# Patient Record
Sex: Female | Born: 1960 | Race: Black or African American | Hispanic: No | Marital: Married | Smoking: Never smoker
Health system: Southern US, Community
[De-identification: ages and names within clinical notes are randomized; demographics above are authoritative.]

## PROBLEM LIST (undated history)

## (undated) DIAGNOSIS — R809 Proteinuria, unspecified: Secondary | ICD-10-CM

## (undated) DIAGNOSIS — E11319 Type 2 diabetes mellitus with unspecified diabetic retinopathy without macular edema: Secondary | ICD-10-CM

## (undated) DIAGNOSIS — I10 Essential (primary) hypertension: Secondary | ICD-10-CM

## (undated) DIAGNOSIS — E119 Type 2 diabetes mellitus without complications: Secondary | ICD-10-CM

## (undated) HISTORY — DX: Proteinuria, unspecified: R80.9

## (undated) HISTORY — PX: LIVER SURGERY: SHX698

## (undated) HISTORY — DX: Essential (primary) hypertension: I10

## (undated) HISTORY — DX: Type 2 diabetes mellitus with unspecified diabetic retinopathy without macular edema: E11.319

## (undated) HISTORY — DX: Type 2 diabetes mellitus without complications: E11.9

## (undated) HISTORY — PX: COLONOSCOPY: SHX174

---

## 2013-09-20 ENCOUNTER — Ambulatory Visit: Payer: Self-pay | Admitting: General Practice

## 2013-10-11 ENCOUNTER — Ambulatory Visit: Payer: Self-pay | Admitting: General Practice

## 2013-11-11 ENCOUNTER — Ambulatory Visit: Payer: Self-pay | Admitting: General Practice

## 2014-05-12 DIAGNOSIS — E119 Type 2 diabetes mellitus without complications: Secondary | ICD-10-CM | POA: Insufficient documentation

## 2014-05-23 DIAGNOSIS — I10 Essential (primary) hypertension: Secondary | ICD-10-CM | POA: Insufficient documentation

## 2014-09-19 DIAGNOSIS — R809 Proteinuria, unspecified: Secondary | ICD-10-CM | POA: Insufficient documentation

## 2016-12-12 DIAGNOSIS — H3582 Retinal ischemia: Secondary | ICD-10-CM | POA: Insufficient documentation

## 2016-12-12 DIAGNOSIS — H25813 Combined forms of age-related cataract, bilateral: Secondary | ICD-10-CM | POA: Insufficient documentation

## 2016-12-12 DIAGNOSIS — E113313 Type 2 diabetes mellitus with moderate nonproliferative diabetic retinopathy with macular edema, bilateral: Secondary | ICD-10-CM | POA: Insufficient documentation

## 2017-01-08 ENCOUNTER — Encounter: Payer: Self-pay | Admitting: Registered Nurse

## 2017-01-08 ENCOUNTER — Encounter: Payer: Self-pay | Admitting: Medical

## 2017-01-08 ENCOUNTER — Other Ambulatory Visit: Payer: Self-pay | Admitting: Registered Nurse

## 2017-01-08 ENCOUNTER — Ambulatory Visit: Payer: Self-pay | Admitting: Registered Nurse

## 2017-01-08 VITALS — BP 170/80 | HR 90 | Temp 98.7°F | Resp 16 | Ht 62.0 in | Wt 149.0 lb

## 2017-01-08 DIAGNOSIS — Z794 Long term (current) use of insulin: Secondary | ICD-10-CM

## 2017-01-08 DIAGNOSIS — E119 Type 2 diabetes mellitus without complications: Secondary | ICD-10-CM

## 2017-01-08 DIAGNOSIS — Z9049 Acquired absence of other specified parts of digestive tract: Secondary | ICD-10-CM | POA: Insufficient documentation

## 2017-01-08 DIAGNOSIS — I10 Essential (primary) hypertension: Secondary | ICD-10-CM

## 2017-01-08 DIAGNOSIS — H6593 Unspecified nonsuppurative otitis media, bilateral: Secondary | ICD-10-CM

## 2017-01-08 DIAGNOSIS — J019 Acute sinusitis, unspecified: Secondary | ICD-10-CM

## 2017-01-08 MED ORDER — SALINE SPRAY 0.65 % NA SOLN: 2.0000 | NASAL | 1 refills | Status: DC

## 2017-01-08 MED ORDER — FLUTICASONE PROPIONATE 50 MCG/ACT NA SUSP: 1.0000 | Freq: Two times a day (BID) | NASAL | 1 refills | Status: DC

## 2017-01-08 MED ORDER — AMOXICILLIN-POT CLAVULANATE 875-125 MG PO TABS: 1.0000 | ORAL_TABLET | Freq: Two times a day (BID) | ORAL | 0 refills | Status: DC

## 2017-01-08 NOTE — Progress Notes (Signed)
Subjective:    Patient ID: Gail Barber, female    DOB: [birth year], 56 y.o.   MRN: [id number]  55y/o african [place] female here for acute cough occasionally productive clear denied exposure to others sick at home or work.  Denied seasonal allergies feels like her bronchitis from about 5 years ago unsure what medications she took.  Denied hemoptysis, wheezing, chest pain, ear pain, ear discharge, sinus pain.  Finger sticks checking twice a day runs 112-140 (her usual).   PMHx Moderate nonproliferative diabetic retinopathy of both eyes with macular edema associated with type 2 diabetes mellitus (CMS-HCC) 12/12/2016 Retinal ischemia 12/12/2016 Combined forms of age-related cataract of both eyes 12/12/2016 Microalbuminuria 09/19/2014 Hypertension 05/23/2014 Type II diabetes mellitus (CMS-HCC) 05/12/2014  PSHx H/O resection of liver  Overview:  for hemangiomas   VESICOVAGINAL FISTULA CLOSURE W/ TAH    HYSTERECTOMY SUPRACERVICAL ABDOMINAL W/REMOVAL TUBES &/OR OVARIES 2000   liver rescetomy 1999   fibroid removal 2012   MYOMECTOMY ABDOMINAL 1989   HYSTERECTOMY    COLONOSCOPY 10/08/2016  Adenomatous Polyp, FH Colon Polyps (Sister): CBF 09/2021    [id number] Brother 3  High blood pressure (Hypertension) Brother 3  Kidney failure Brother 3  Alcohol abuse Father   Diabetes Maternal Grandmother   Anemia Mother   Diabetes Mother   Colon polyps Sister 2  Diabetes Sister 2  High blood pressure (Hypertension) Sister 2  Lung cancer Sister 2         Review of Systems  Constitutional: Negative for activity change, appetite change, chills, diaphoresis, fatigue, fever and unexpected weight change.  HENT: Positive for congestion, postnasal drip, rhinorrhea and sore throat. Negative for dental problem, drooling, ear discharge, ear pain, facial swelling, hearing loss, mouth sores, nosebleeds, sinus pain, sinus pressure, sneezing, tinnitus, trouble  swallowing and voice change.   Eyes: Negative for photophobia, pain, discharge, redness, itching and visual disturbance.  Respiratory: Positive for cough. Negative for choking, chest tightness, shortness of breath, wheezing and stridor.   Cardiovascular: Negative for chest pain, palpitations and leg swelling.  Gastrointestinal: Negative for abdominal distention, abdominal pain, blood in stool, constipation, diarrhea, nausea and vomiting.  Endocrine: Negative for cold intolerance and heat intolerance.  Genitourinary: Negative for difficulty urinating, dysuria and hematuria.  Musculoskeletal: Negative for arthralgias, back pain, gait problem, joint swelling, myalgias, neck pain and neck stiffness.  Skin: Negative for color change, pallor, rash and wound.  Allergic/Immunologic: Negative for environmental allergies and food allergies.  Neurological: Negative for dizziness, tremors, seizures, syncope, facial asymmetry, speech difficulty, weakness, light-headedness, numbness and headaches.  Hematological: Negative for adenopathy. Does not bruise/bleed easily.  Psychiatric/Behavioral: Negative for agitation, behavioral problems, confusion and sleep disturbance.       Objective:   Physical Exam  Constitutional: She is oriented to person, place, and time. She appears well-developed and well-nourished. She is active and cooperative.  Non-toxic appearance. She does not have a sickly appearance. She does not appear ill. No distress.  HENT:  Head: Normocephalic and atraumatic.  Right Ear: Hearing, external ear and ear canal normal. A middle ear effusion is present.  Left Ear: Hearing, external ear and ear canal normal. A middle ear effusion is present.  Nose: Mucosal edema and rhinorrhea present. No nose lacerations, sinus tenderness, nasal deformity, septal deviation or nasal septal hematoma. No epistaxis.  No foreign bodies. Right sinus exhibits no maxillary sinus tenderness and no frontal sinus  tenderness. Left sinus exhibits no maxillary sinus tenderness and no frontal sinus tenderness.  Mouth/Throat: Uvula is midline  and mucous membranes are normal. Mucous membranes are not pale, not dry and not cyanotic. She does not have dentures. No oral lesions. No trismus in the jaw. Normal dentition. No dental abscesses, uvula swelling, lacerations or dental caries. Posterior oropharyngeal edema and posterior oropharyngeal erythema present. No oropharyngeal exudate or tonsillar abscesses.  Frequent sniffing in exam room; clear nasal discharge bilaterally; bilateral allergic shiners; cobblestoning posterior pharynx; bilateral TMs with air fluid Barber 50% opacity  Eyes: Conjunctivae, EOM and lids are normal. Pupils are equal, round, and reactive to light. Right eye exhibits no chemosis, no discharge, no exudate and no hordeolum. No foreign body present in the right eye. Left eye exhibits no chemosis, no discharge, no exudate and no hordeolum. No foreign body present in the left eye. Right conjunctiva is not injected. Right conjunctiva has no hemorrhage. Left conjunctiva is not injected. Left conjunctiva has no hemorrhage. No scleral icterus. Right eye exhibits normal extraocular motion and no nystagmus. Left eye exhibits normal extraocular motion and no nystagmus. Right pupil is round and reactive. Left pupil is round and reactive. Pupils are equal.  Neck: Trachea normal and normal range of motion. Neck supple. No tracheal tenderness, no spinous process tenderness and no muscular tenderness present. No neck rigidity. No tracheal deviation, no edema, no erythema and normal range of motion present. No thyroid mass and no thyromegaly present.  Cardiovascular: Normal rate, regular rhythm, S1 normal, S2 normal, normal heart sounds and intact distal pulses.  PMI is not displaced.  Exam reveals no gallop and no friction rub.   No murmur heard. Pulmonary/Chest: Effort normal and breath sounds normal. No accessory  muscle usage or stridor. No respiratory distress. She has no decreased breath sounds. She has no wheezes. She has no rhonchi. She has no rales. She exhibits no tenderness.  sp02 99% room air while speaking full sentences without difficulty cough not observed in exam room  Abdominal: Soft. She exhibits no distension.  Musculoskeletal: Normal range of motion. She exhibits no edema or tenderness.       Right shoulder: Normal.       Left shoulder: Normal.       Right hip: Normal.       Left hip: Normal.       Right knee: Normal.       Left knee: Normal.       Cervical back: Normal.       Right hand: Normal.       Left hand: Normal.  Lymphadenopathy:       Head (right side): No submental, no submandibular, no tonsillar, no preauricular, no posterior auricular and no occipital adenopathy present.       Head (left side): No submental, no submandibular, no tonsillar, no preauricular, no posterior auricular and no occipital adenopathy present.    She has no cervical adenopathy.       Right cervical: No superficial cervical, no deep cervical and no posterior cervical adenopathy present.      Left cervical: No superficial cervical, no deep cervical and no posterior cervical adenopathy present.  Neurological: She is alert and oriented to person, place, and time. She has normal strength. She is not disoriented. She displays no atrophy and no tremor. No cranial nerve deficit or sensory deficit. She exhibits normal muscle tone. She displays no seizure activity. Coordination and gait normal. GCS eye subscore is 4. GCS verbal subscore is 5. GCS motor subscore is 6.  Skin: Skin is warm, dry and intact. No abrasion, no  bruising, no burn, no ecchymosis, no laceration, no lesion, no petechiae and no rash noted. She is not diaphoretic. No cyanosis or erythema. No pallor. Nails show no clubbing.  Psychiatric: She has a normal mood and affect. Her speech is normal and behavior is normal. Judgment and thought content  normal. Cognition and memory are normal.  Nursing note and vitals reviewed.   Vitals:   01/08/17 1010  BP: (!) 170/80  Pulse: 90  Resp: 16  Temp: 98.7 F (37.1 C)        Assessment & Plan:  A-acute rhinitis, otitis media effusion bilaterally, hypertension  P- Supportive treatment.   No evidence of invasive bacterial infection, non toxic and well hydrated.  This is most likely self limiting viral infection.  I do not see where any further testing or imaging is necessary at this time.   I will suggest supportive care, rest, good hygiene and encourage the patient to take adequate fluids.  The patient is to return to clinic or EMERGENCY ROOM if symptoms worsen or change significantly e.g. ear pain, fever, purulent discharge from ears or bleeding.  Start augmentin 875mg  po BID x 10 days #20 RF0 electronic Rx given if ear pain/discharge/fever.  Patient verbalized agreement and understanding of treatment plan and had no further questions at this time     Suspect Viral illness: no evidence of invasive bacterial infection, non toxic and well hydrated.  This is most likely self limiting viral infection.  I do not see where any further testing or imaging is necessary at this time.   I will suggest supportive care, rest, good hygiene and encourage the patient to take adequate fluids.  Does not require work excuse.   Rx flonase 1 spray each nostril BID prn, nasal saline 1-2 sprays each nostril prn q2h.  Avoid aleve/naproxen 500mg  po BID/ibuprofen/advil/ motrin 800mg  po TID prn as will increase blood pressure.  Discussed sugar free cough lozenges with lemon and salt water gargles for comfort also.  The patient is to return to clinic or EMERGENCY ROOM if symptoms worsen or change significantly e.g. fever, lethargy, SOB, wheezing.  Patient verbalized agreement and understanding of treatment plan.    start flonase 1 spray each nostril BID #1 RF0 Rx given electronically, saline 2 sprays each nostril q2h prn  congestion.  If no improvement with 48 hours of saline and flonase use start augmentin 875mg  po BID x 10 days.  Rx given electronically .  No evidence of systemic bacterial infection, non toxic and well hydrated.  I do not see where any further testing or imaging is necessary at this time.   I will suggest supportive care, rest, good hygiene and encourage the patient to take adequate fluids.  The patient is to return to clinic or EMERGENCY ROOM if symptoms worsen or change significantly.  Exitcare handout on sinusitis given to patient.  Patient verbalized agreement and understanding of treatment plan and had no further questions at this time.   P2:  Hand washing and cover cough  Usually no specific medical treatment is needed if a virus is causing the sore throat.  The throat most often gets better on its own within 5 to 7 days.  Antibiotic medicine does not cure viral pharyngitis.   For acute pharyngitis caused by bacteria, your healthcare provider will prescribe an antibiotic.  Marland Kitchen Do not smoke.  Marland Kitchen Avoid secondhand smoke and other air pollutants.  . Use a cool mist humidifier to add moisture to the air.  Marland Kitchen  Get plenty of rest.  . You may want to rest your throat by talking less and eating a diet that is mostly liquid or soft for a day or two.   Marland Kitchen Nonprescription throat lozenges and mouthwashes should help relieve the soreness.   . Gargling with warm saltwater and drinking warm liquids may help.  (You can make a saltwater solution by adding 1/4 teaspoon of salt to 8 ounces, or 240 mL, of warm water.)  . A nonprescription pain reliever such as aspirin, acetaminophen, or ibuprofen may ease general aches and pains.   FOLLOW UP with clinic provider if no improvements in the next 7-10 days.  Patient verbalized understanding of instructions and agreed with plan of care. P2:  Hand washing and diet.  Shower BID.  Hydrate.  Humidifier.  Suspect viral post nasal drip cause of inflammation discussed flonase and  nasal saline to dry up drip.  Avoid sudafed as will increase blood pressure.  Bronchitis simple, community acquired, may have started as viral (probably respiratory syncytial, parainfluenza, influenza, or adenovirus), but now evidence of acute purulent bronchitis with resultant bronchial edema and mucus formation.  Viruses are the most common cause of bronchial inflammation in otherwise healthy adults with acute bronchitis.  The appearance of sputum is not predictive of whether a bacterial infection is present.  Purulent sputum is most often caused by viral infections.  There are a small portion of those caused by non-viral agents being Mycoplamsa pneumonia.  Microscopic examination or C&S of sputum in the healthy adult with acute bronchitis is generally not helpful (usually negative or normal respiratory flora) other considerations being cough from upper respiratory tract infections, sinusitis or allergic syndromes (mild asthma or viral pneumonia).  Differential Diagnosis:  reactive airway disease (asthma, allergic aspergillosis (eosinophilia), chronic bronchitis, respiratory infection (Sinusitis, Common cold, pneumonia), congestive heart failure, reflux esophagitis, bronchogenic tumor, aspiration syndromes and/or exposure irritants/tobacco smoke.  In this case, there is no evidence of any invasive bacterial illness.  Most likely viral etiology so will hold on antibiotic treatment.  Advise supportive care with rest, encourage fluids, good hygiene and watch for any worsening symptoms.  If they were to develop:  come back to the office or go to the emergency room if after hours.  Without high fever, severe dyspnea, lack of physical findings or other risk factors, I will hold on a chest radiograph and CBC at this time. I discussed that approximately 50% of patients with acute bronchitis have a cough that lasts up to three weeks, and 25% for over a month.  Tylenol, one to two tablets every four hours as needed for  fever or myalgias.   No aspirin.  Patient instructed to follow up in one week or sooner if symptoms worsen. Patient verbalized agreement and understanding of treatment plan.  P2:  hand washing and cover cough  Hypertension sick today take medications as prescribed.  Continue current medications as directed.  Continue to monitor blood pressure at home and maintain log of blood pressure and pulse to bring to follow up appointments.  Continue low sodium diet and exercise program.  Recommended weight loss/weight maintenance to BMI 20-25.  Return to the clinic if any new symptoms.  Follow up hand/feet swelling, chest pain, shortness of breath, worst headache of life.  Patient verbalized agreement and understanding of treatment plan and had no further questions at this time.   P2:  Diet and Exercise specific for HTN

## 2017-01-08 NOTE — Patient Instructions (Addendum)
Start taking augmentin 875mg  by mouth twice a day if cough not improved after using fluticasone and nasal saline twice a day for 2 days Hydrate Tylenol 1000mg  by mouth every 6 hours as needed for pain (cautious use short term due to liver resection) Avoid motrin/advil/aleve/naproxen/ibuprofen due to high BP will raise blood pressure Shower twice a day  Otitis Media, Adult Otitis media is redness, soreness, and puffiness (swelling) in the space just behind your eardrum (middle ear). It may be caused by allergies or infection. It often happens along with a cold. Follow these instructions at home:  Take your medicine as told. Finish it even if you start to feel better.  Only take over-the-counter or prescription medicines for pain, discomfort, or fever as told by your doctor.  Follow up with your doctor as told. Contact a doctor if:  You have otitis media only in one ear, or bleeding from your nose, or both.  You notice a lump on your neck.  You are not getting better in 3-5 days.  You feel worse instead of better. Get help right away if:  You have pain that is not helped with medicine.  You have puffiness, redness, or pain around your ear.  You get a stiff neck.  You cannot move part of your face (paralysis).  You notice that the bone behind your ear hurts when you touch it. This information is not intended to replace advice given to you by your health care provider. Make sure you discuss any questions you have with your health care provider. Document Released: 04/15/2008 Document Revised: 04/04/2016 Document Reviewed: 05/25/2013 Elsevier Interactive Patient Education  2017 [employer] Acute Bronchitis, Adult Acute bronchitis is when air tubes (bronchi) in the lungs suddenly get swollen. The condition can make it hard to breathe. It can also cause these symptoms:  A cough.  Coughing up clear, yellow, or green mucus.  Wheezing.  Chest congestion.  Shortness of  breath.  A fever.  Body aches.  Chills.  A sore throat. Follow these instructions at home: Medicines   Take over-the-counter and prescription medicines only as told by your doctor.  If you were prescribed an antibiotic medicine, take it as told by your doctor. Do not stop taking the antibiotic even if you start to feel better. General instructions   Rest.  Drink enough fluids to keep your pee (urine) clear or pale yellow.  Avoid smoking and secondhand smoke. If you smoke and you need help quitting, ask your doctor. Quitting will help your lungs heal faster.  Use an inhaler, cool mist vaporizer, or humidifier as told by your doctor.  Keep all follow-up visits as told by your doctor. This is important. How is this prevented? To lower your risk of getting this condition again:  Wash your hands often with soap and water. If you cannot use soap and water, use hand sanitizer.  Avoid contact with people who have cold symptoms.  Try not to touch your hands to your mouth, nose, or eyes.  Make sure to get the flu shot every year. Contact a doctor if:  Your symptoms do not get better in 2 weeks. Get help right away if:  You cough up blood.  You have chest pain.  You have very bad shortness of breath.  You become dehydrated.  You faint (pass out) or keep feeling like you are going to pass out.  You keep throwing up (vomiting).  You have a very bad headache.  Your fever or  chills gets worse. This information is not intended to replace advice given to you by your health care provider. Make sure you discuss any questions you have with your health care provider. Document Released: 04/15/2008 Document Revised: 06/05/2016 Document Reviewed: 04/17/2016 Elsevier Interactive Patient Education  2017 [employer] Nonallergic Rhinitis Nonallergic rhinitis is a condition that causes symptoms that affect the nose, such as a runny nose and a stuffed-up nose (nasal congestion) that  can make it hard to breathe through the nose. This condition is different from having an allergy (allergic rhinitis). Allergic rhinitis occurs when the body's defense system (immune system) reacts to a substance that you are allergic to (allergen), such as pollen, pet dander, mold, or dust. Nonallergic rhinitis has many similar symptoms, but it is not caused by allergens. Nonallergic rhinitis can be a short-term or long-term problem. What are the causes? This condition can be caused by many different things. Some common types of nonallergic rhinitis include: Infectious rhinitis   This is usually due to an infection in the upper respiratory tract. Vasomotor rhinitis   This is the most common type of long-term nonallergic rhinitis.  It is caused by too much blood flow through the nose, which makes the tissue inside of the nose swell.  Symptoms are often triggered by strong odors, cold air, stress, drinking alcohol, cigarette smoke, or changes in the weather. Occupational rhinitis   This type is caused by triggers in the workplace, such as chemicals, dusts, animal dander, or air pollution. Hormonal rhinitis   This type occurs in women as a result of an increase in the female hormone estrogen.  It may occur during pregnancy, puberty, and menstrual cycles.  Symptoms improve when estrogen levels drop. Drug-induced rhinitis  Several drugs can cause nonallergic rhinitis, including:  Medicines that are used to treat high blood pressure, heart disease, and Parkinson disease.  Aspirin and NSAIDs.  Over-the-counter nasal decongestant sprays. These can cause a type of nonallergic rhinitis (rhinitis medicamentosa) when they are used for more than a few days. Nonallergic rhinitis with eosinophilia syndrome (NARES)   This type is caused by having too much of a certain type of white blood cell (eosinophil). Nonallergic rhinitis can also be caused by a reaction to eating hot or spicy foods. This does  not usually cause long-term symptoms. In some cases, the cause of nonallergic rhinitis is not known. What increases the risk? You are more likely to develop this condition if:  You are 69-65 years of age.  You are a woman. Women are twice as likely to have this condition. What are the signs or symptoms? Common symptoms of this condition include:  Nasal congestion.  Runny nose.  The feeling of mucus going down the back of the throat (postnasal drip).  Trouble sleeping at night and daytime sleepiness. Less common symptoms include:  Sneezing.  Coughing.  Itchy nose.  Bloodshot eyes. How is this diagnosed? This condition may be diagnosed based on:  Your symptoms and medical history.  A physical exam.  Allergy testing to rule out allergic rhinitis. You may have skin tests or blood tests. In some cases, the health care provider may take a swab of nasal secretions to look for an increased number of eosinophils. This would be done to confirm a diagnosis of NARES. How is this treated? Treatment for this condition depends on the cause. No single treatment works for everyone. Work with your health care provider to find the best treatment for you. Treatment may include:  Avoiding the things that trigger your symptoms.  Using medicines to relieve congestion, such as:  Steroid nasal spray. There are many types. You may need to try a few to find out which one works best.  Best boy medicine. This may be an oral medicine or a nasal spray. These medicines are only used for a short time.  Using medicines to relieve a runny nose. These may include antihistamine medicines or anticholinergic nasal sprays. Surgery to remove tissue from inside the nose may be needed in severe cases if the condition has not improved after 6-12 months of medical treatment. Follow these instructions at home:  Take or use over-the-counter and prescription medicines only as told by your health care provider.  Do not stop using your medicine even if you start to feel better.  Use salt-water (saline) rinses or other solutions (nasal washes or irrigations) to wash or rinse out the inside of your nose as told by your health care provider.  Do not take NSAIDs or medicines that contain aspirin if they make your symptoms worse.  Do not drink alcohol if it makes your symptoms worse.  Do not use any tobacco products, such as cigarettes, chewing tobacco, and e-cigarettes. If you need help quitting, ask your health care provider.  Avoid secondhand smoke.  Get some exercise every day. Exercise may help reduce symptoms of nonallergic rhinitis for some people. Ask your health care provider how much exercise and what types of exercise are safe for you.  Sleep with the head of your bed raised (elevated). This may reduce nighttime nasal congestion.  Keep all follow-up visits as told by your health care provider. This is important. Contact a health care provider if:  You have a fever.  Your symptoms are getting worse at home.  Your symptoms are not responding to medicine.  You develop new symptoms, especially a headache or nosebleed. This information is not intended to replace advice given to you by your health care provider. Make sure you discuss any questions you have with your health care provider. Document Released: 02/19/2016 Document Revised: 04/04/2016 Document Reviewed: 01/18/2016 Elsevier Interactive Patient Education  2017 [employer].

## 2017-01-10 ENCOUNTER — Telehealth: Payer: Self-pay | Admitting: Medical

## 2017-01-10 MED ORDER — BENZONATATE 100 MG PO CAPS: 100.0000 mg | ORAL_CAPSULE | Freq: Three times a day (TID) | ORAL | 0 refills | Status: DC | PRN

## 2017-01-10 NOTE — Telephone Encounter (Signed)
Called Gail Barber, let her know that [facility] sent rx for tessalon perles to [facility].

## 2017-01-10 NOTE — Telephone Encounter (Signed)
Gail Barber called and said that she can't sleep for coughing. Saw PA this week got antibiotic and nose spray but now needs something for cough. Can you call her in something at [facility]?

## 2017-02-26 ENCOUNTER — Other Ambulatory Visit: Payer: Self-pay | Admitting: *Deleted

## 2017-02-26 DIAGNOSIS — E781 Pure hyperglyceridemia: Secondary | ICD-10-CM

## 2017-02-26 DIAGNOSIS — Z794 Long term (current) use of insulin: Secondary | ICD-10-CM

## 2017-02-26 DIAGNOSIS — E118 Type 2 diabetes mellitus with unspecified complications: Secondary | ICD-10-CM

## 2017-02-27 LAB — SPECIMEN STATUS

## 2017-02-28 LAB — LIPID PANEL
CHOLESTEROL TOTAL: 180 mg/dL (ref 100–199)
Chol/HDL Ratio: 3.5 ratio (ref 0.0–4.4)
HDL: 52 mg/dL (ref >39)
LDL CALC: 88 mg/dL (ref 0–99)
Triglycerides: 200 mg/dL — ABNORMAL HIGH (ref 0–149)
VLDL Cholesterol Cal: 40 mg/dL (ref 5–40)

## 2017-02-28 LAB — BASIC METABOLIC PANEL
BUN/Creatinine Ratio: 38 — ABNORMAL HIGH (ref 9–23)
BUN: 24 mg/dL (ref 6–24)
CO2: 21 mmol/L (ref 18–29)
CREATININE: 0.64 mg/dL (ref 0.57–1.00)
Calcium: 9.8 mg/dL (ref 8.7–10.2)
Chloride: 100 mmol/L (ref 96–106)
GFR calc Af Amer: 117 mL/min/{1.73_m2} (ref >59)
GFR calc non Af Amer: 101 mL/min/{1.73_m2} (ref >59)
GLUCOSE: 119 mg/dL — AB (ref 65–99)
Potassium: 4.3 mmol/L (ref 3.5–5.2)
SODIUM: 142 mmol/L (ref 134–144)

## 2017-02-28 LAB — HGB A1C W/O EAG: HEMOGLOBIN A1C: 8.7 % — AB (ref 4.8–5.6)

## 2017-02-28 LAB — VITAMIN B12: VITAMIN B 12: 1448 pg/mL — AB (ref 232–1245)

## 2017-07-01 ENCOUNTER — Other Ambulatory Visit: Payer: Self-pay

## 2017-07-01 DIAGNOSIS — E781 Pure hyperglyceridemia: Secondary | ICD-10-CM

## 2017-07-01 DIAGNOSIS — R809 Proteinuria, unspecified: Principal | ICD-10-CM

## 2017-07-01 DIAGNOSIS — E1129 Type 2 diabetes mellitus with other diabetic kidney complication: Secondary | ICD-10-CM

## 2017-07-01 DIAGNOSIS — Z794 Long term (current) use of insulin: Principal | ICD-10-CM

## 2017-07-02 LAB — SPECIMEN STATUS

## 2017-07-02 LAB — BASIC METABOLIC PANEL
BUN / CREAT RATIO: 23 (ref 9–23)
BUN: 18 mg/dL (ref 6–24)
CALCIUM: 10 mg/dL (ref 8.7–10.2)
CHLORIDE: 101 mmol/L (ref 96–106)
CO2: 22 mmol/L (ref 20–29)
Creatinine, Ser: 0.79 mg/dL (ref 0.57–1.00)
GFR calc non Af Amer: 85 mL/min/{1.73_m2} (ref >59)
GFR, EST AFRICAN AMERICAN: 97 mL/min/{1.73_m2} (ref >59)
Glucose: 113 mg/dL — ABNORMAL HIGH (ref 65–99)
POTASSIUM: 4.5 mmol/L (ref 3.5–5.2)
Sodium: 141 mmol/L (ref 134–144)

## 2017-07-02 LAB — LIPID PANEL
CHOL/HDL RATIO: 3.4 ratio (ref 0.0–4.4)
Cholesterol, Total: 171 mg/dL (ref 100–199)
HDL: 51 mg/dL (ref >39)
LDL CALC: 86 mg/dL (ref 0–99)
TRIGLYCERIDES: 172 mg/dL — AB (ref 0–149)
VLDL Cholesterol Cal: 34 mg/dL (ref 5–40)

## 2017-07-02 LAB — MICROALBUMIN / CREATININE URINE RATIO
CREATININE, UR: 100.3 mg/dL
Microalb/Creat Ratio: 73.9 mg/g creat — ABNORMAL HIGH (ref 0.0–30.0)
Microalbumin, Urine: 74.1 ug/mL

## 2017-07-02 LAB — SPECIMEN STATUS REPORT

## 2017-07-02 LAB — TSH: TSH: 1.93 u[IU]/mL (ref 0.450–4.500)

## 2017-07-02 NOTE — Addendum Note (Signed)
Addended by: Maura Crandall on: 07/02/2017 01:32 PM   Modules accepted: Orders

## 2017-07-03 LAB — VITAMIN B12

## 2017-07-03 LAB — HGB A1C W/O EAG: HEMOGLOBIN A1C: 8.8 % — AB (ref 4.8–5.6)

## 2017-07-03 LAB — SPECIMEN STATUS REPORT

## 2017-08-20 ENCOUNTER — Other Ambulatory Visit: Payer: Self-pay | Admitting: Physician Assistant

## 2017-08-20 DIAGNOSIS — Z0000 Encounter for general adult medical examination without abnormal findings: Secondary | ICD-10-CM

## 2017-08-20 DIAGNOSIS — Z1239 Encounter for other screening for malignant neoplasm of breast: Secondary | ICD-10-CM

## 2017-08-27 ENCOUNTER — Ambulatory Visit: Payer: Self-pay

## 2017-08-27 DIAGNOSIS — Z23 Encounter for immunization: Secondary | ICD-10-CM

## 2017-09-10 ENCOUNTER — Other Ambulatory Visit: Payer: Self-pay

## 2017-09-12 ENCOUNTER — Other Ambulatory Visit: Payer: Self-pay

## 2017-09-12 DIAGNOSIS — Z794 Long term (current) use of insulin: Principal | ICD-10-CM

## 2017-09-12 DIAGNOSIS — R809 Proteinuria, unspecified: Principal | ICD-10-CM

## 2017-09-12 DIAGNOSIS — E1129 Type 2 diabetes mellitus with other diabetic kidney complication: Secondary | ICD-10-CM

## 2017-09-12 DIAGNOSIS — E781 Pure hyperglyceridemia: Secondary | ICD-10-CM

## 2017-09-13 LAB — BASIC METABOLIC PANEL
BUN / CREAT RATIO: 29 — AB (ref 9–23)
BUN: 20 mg/dL (ref 6–24)
CALCIUM: 9.7 mg/dL (ref 8.7–10.2)
CO2: 21 mmol/L (ref 20–29)
CREATININE: 0.69 mg/dL (ref 0.57–1.00)
Chloride: 101 mmol/L (ref 96–106)
GFR calc non Af Amer: 98 mL/min/{1.73_m2} (ref >59)
GFR, EST AFRICAN AMERICAN: 113 mL/min/{1.73_m2} (ref >59)
Glucose: 73 mg/dL (ref 65–99)
Potassium: 4.3 mmol/L (ref 3.5–5.2)
Sodium: 140 mmol/L (ref 134–144)

## 2017-09-13 LAB — B12 AND FOLATE PANEL
Folate: 16.1 ng/mL (ref >3.0)
Vitamin B-12: 1510 pg/mL — ABNORMAL HIGH (ref 232–1245)

## 2017-09-13 LAB — LIPID PANEL
Chol/HDL Ratio: 3 ratio (ref 0.0–4.4)
Cholesterol, Total: 173 mg/dL (ref 100–199)
HDL: 58 mg/dL (ref >39)
LDL Calculated: 89 mg/dL (ref 0–99)
TRIGLYCERIDES: 130 mg/dL (ref 0–149)
VLDL Cholesterol Cal: 26 mg/dL (ref 5–40)

## 2017-09-13 LAB — HGB A1C W/O EAG: HEMOGLOBIN A1C: 8.8 % — AB (ref 4.8–5.6)

## 2017-09-13 LAB — TSH: TSH: 1.8 u[IU]/mL (ref 0.450–4.500)

## 2017-09-13 LAB — SPECIMEN STATUS REPORT

## 2017-09-18 LAB — MICROALBUMIN / CREATININE URINE RATIO
Creatinine, Urine: 51.4 mg/dL
MICROALB/CREAT RATIO: 101.9 mg/g{creat} — AB (ref 0.0–30.0)
MICROALBUM., U, RANDOM: 52.4 ug/mL

## 2017-10-07 DIAGNOSIS — Z961 Presence of intraocular lens: Secondary | ICD-10-CM | POA: Insufficient documentation

## 2017-10-07 DIAGNOSIS — E113391 Type 2 diabetes mellitus with moderate nonproliferative diabetic retinopathy without macular edema, right eye: Secondary | ICD-10-CM | POA: Insufficient documentation

## 2017-12-17 ENCOUNTER — Other Ambulatory Visit: Payer: Self-pay

## 2017-12-17 DIAGNOSIS — E1100 Type 2 diabetes mellitus with hyperosmolarity without nonketotic hyperglycemic-hyperosmolar coma (NKHHC): Secondary | ICD-10-CM

## 2017-12-18 LAB — HGB A1C W/O EAG: Hgb A1c MFr Bld: 7.9 % — ABNORMAL HIGH (ref 4.8–5.6)

## 2018-02-25 ENCOUNTER — Other Ambulatory Visit: Payer: Self-pay

## 2018-02-25 DIAGNOSIS — E119 Type 2 diabetes mellitus without complications: Secondary | ICD-10-CM

## 2018-02-25 DIAGNOSIS — E1129 Type 2 diabetes mellitus with other diabetic kidney complication: Secondary | ICD-10-CM

## 2018-02-25 DIAGNOSIS — E781 Pure hyperglyceridemia: Secondary | ICD-10-CM

## 2018-02-25 DIAGNOSIS — R809 Proteinuria, unspecified: Secondary | ICD-10-CM

## 2018-02-25 DIAGNOSIS — Z794 Long term (current) use of insulin: Principal | ICD-10-CM

## 2018-02-26 LAB — BASIC METABOLIC PANEL WITH GFR
BUN/Creatinine Ratio: 42 — ABNORMAL HIGH (ref 9–23)
BUN: 24 mg/dL (ref 6–24)
CO2: 21 mmol/L (ref 20–29)
Calcium: 9.8 mg/dL (ref 8.7–10.2)
Chloride: 102 mmol/L (ref 96–106)
Creatinine, Ser: 0.57 mg/dL (ref 0.57–1.00)
GFR calc Af Amer: 121 mL/min/1.73
GFR calc non Af Amer: 105 mL/min/1.73
Glucose: 135 mg/dL — ABNORMAL HIGH (ref 65–99)
Potassium: 4 mmol/L (ref 3.5–5.2)
Sodium: 142 mmol/L (ref 134–144)

## 2018-02-26 LAB — HGB A1C W/O EAG: Hgb A1c MFr Bld: 8.1 % — ABNORMAL HIGH (ref 4.8–5.6)

## 2018-02-27 LAB — LIPID PANEL
CHOL/HDL RATIO: 3.2 ratio (ref 0.0–4.4)
Cholesterol, Total: 180 mg/dL (ref 100–199)
HDL: 57 mg/dL (ref >39)
LDL Calculated: 93 mg/dL (ref 0–99)
Triglycerides: 150 mg/dL — ABNORMAL HIGH (ref 0–149)
VLDL CHOLESTEROL CAL: 30 mg/dL (ref 5–40)

## 2018-05-04 ENCOUNTER — Encounter: Payer: Self-pay | Admitting: Adult Health

## 2018-05-04 ENCOUNTER — Ambulatory Visit: Payer: Self-pay | Admitting: Adult Health

## 2018-05-04 VITALS — BP 138/85 | HR 78 | Temp 98.7°F | Resp 18 | Wt 143.8 lb

## 2018-05-04 DIAGNOSIS — H6501 Acute serous otitis media, right ear: Secondary | ICD-10-CM

## 2018-05-04 DIAGNOSIS — J40 Bronchitis, not specified as acute or chronic: Secondary | ICD-10-CM

## 2018-05-04 MED ORDER — PREDNISONE 10 MG (21) PO TBPK: ORAL_TABLET | ORAL | 0 refills | Status: DC

## 2018-05-04 MED ORDER — FLUTICASONE PROPIONATE 50 MCG/ACT NA SUSP: 1.0000 | Freq: Two times a day (BID) | NASAL | 0 refills | Status: DC

## 2018-05-04 MED ORDER — LORATADINE 10 MG PO TABS
10.0000 mg | ORAL_TABLET | Freq: Every day | ORAL | 3 refills | Status: DC
Start: 2018-05-04 — End: 2019-04-07

## 2018-05-04 MED ORDER — FLUTICASONE PROPIONATE 50 MCG/ACT NA SUSP: 2.0000 | Freq: Every day | NASAL | 3 refills | Status: DC

## 2018-05-04 MED ORDER — AMOXICILLIN-POT CLAVULANATE 875-125 MG PO TABS: 1.0000 | ORAL_TABLET | Freq: Two times a day (BID) | ORAL | 0 refills | Status: DC

## 2018-05-04 NOTE — Patient Instructions (Signed)
Diabetes Mellitus and Sick Day Management Blood sugar (glucose) can be difficult to control when you are sick. Common illnesses that can cause problems for people with diabetes (diabetes mellitus) include colds, fever, flu (influenza), nausea, vomiting, and diarrhea. These illnesses can cause stress and loss of body fluids (dehydration), and those issues can cause blood glucose levels to increase. Because of this, it is very important to take your insulin and diabetes medicines and eat some form of carbohydrate when you are sick. You should make a plan for days when you are sick (sick day plan) as part of your diabetes management plan. You and your health care provider should make this plan in advance. The following guidelines are intended to help you manage an illness that lasts for about 24 hours or less. Your health care provider may also give you more specific instructions. What do I need to do to manage my blood glucose?  Check your blood glucose every 2-4 hours, or as often as told by your health care provider.  Know your sick day treatment goals. Your target blood glucose levels may be different when you are sick.  If you use insulin, take your usual dose. ? If your blood glucose continues to be too high, you may need to take an additional insulin dose as told by your health care provider.  If you use oral diabetes medicine, you may need to stop taking it if you are not able to eat or drink normally. Ask your health care provider about whether you need to stop taking these medicines while you are sick.  If you use injectable hormone medicines other than insulin to control your diabetes, ask your health care provider about whether you need to stop taking these medicines while you are sick. What else can I do to manage my diabetes when I am sick? Check your ketones  If you have type 1 diabetes, check your urine ketones every 4 hours.  If you have type 2 diabetes, check your urine ketones as  often as told by your health care provider. Drink fluids  Drink enough fluid to keep your urine clear or pale yellow. This is especially important if you have a fever, vomiting, or diarrhea. Those symptoms can lead to dehydration.  Follow any instructions from your health care provider about beverages to avoid. ? Do not drink alcohol, caffeine, or drinks that contain a lot of sugar. Take medicines as directed  Take-over-the-counter and prescription medicines only as told by your health care provider.  Check medicine labels for added sugars. Some medicines may contain sugar or types of sugars that can raise your blood glucose level. What foods can I eat when I am sick? You need to eat some form of carbohydrates when you are sick. You should eat 45-50 grams (45-50 g) of carbohydrates every 3-4 hours until you feel better. All of the food choices below contain about 15 g of carbohydrates. Plan ahead and keep some of these foods around so you have them if you get sick.  4-6 oz (120-177 mL) carbonated beverage that contains sugar, such as regular (not diet) soda. You may be able to drink carbonated beverages more easily if you open the beverage and let it sit at room temperature for a few minutes before drinking.   of a twin frozen ice pop.  4 oz (120 g) regular gelatin.  4 oz (120 mL) fruit juice.  4 oz (120 g) ice cream or frozen yogurt.  2 oz (60  g) sherbet.  8 oz (240 mL) clear broth or soup.  4 oz (120 g) regular custard.  4 oz (120 g) regular pudding.  8 oz (240 g) plain yogurt.  1 slice bread or toast.  6 saltine crackers.  5 vanilla wafers.  Questions to ask your health care provider Consider asking the following questions so you know what to do on days when you are sick:  Should I adjust my diabetes medicines?  How often do I need to check my blood glucose?  What supplies do I need to manage my diabetes at home when I am sick?  What number can I call if I have  questions?  What foods and drinks should I avoid?  Contact a health care provider if:  You develop symptoms of diabetic ketoacidosis, such as: ? Fatigue. ? Weight loss. ? Excessive thirst. ? Light-headedness. ? Fruity or sweet-smelling breath. ? Excessive urination. ? Vision changes. ? Confusion or irritability. ? Nausea. ? Vomiting. ? Rapid breathing. ? Pain in the abdomen. ? Feeling flushed.  You are unable to drink fluids without vomiting.  You have any of the following for more than 6 hours: ? Nausea. ? Vomiting. ? Diarrhea.  Your blood glucose is at or above 240 mg/dL (13.3 mmol/L), even after you take an additional insulin dose.  You have a change in how you think, feel, or act (mental status).  You develop another serious illness.  You have been sick or have had a fever for 2 days or longer and you are not getting better. Get help right away if:  Your blood glucose is lower than 54 mg/dL (3.0 mmol/L).  You have difficulty breathing.  You have moderate or high ketone levels in your urine.  You used emergency glucagon to treat low blood glucose. Summary  Blood sugar (glucose) can be difficult to control when you are sick. Common illnesses that can cause problems for people with diabetes (diabetes mellitus) include colds, fever, flu (influenza), nausea, vomiting, and diarrhea.  Illnesses can cause stress and loss of body fluids (dehydration), and those issues can cause blood glucose levels to increase.  Make a plan for days when you are sick (sick day plan) as part of your diabetes management plan. You and your health care provider should make this plan in advance.  It is very important to take your insulin and diabetes medicines and to eat some form of carbohydrate when you are sick.  Contact your health care provider if have problems managing your blood glucose levels when you are sick, or if you have been sick or had a fever for 2 days or longer and are  not getting better. This information is not intended to replace advice given to you by your health care provider. Make sure you discuss any questions you have with your health care provider. Document Released: 10/31/2003 Document Revised: 07/26/2016 Document Reviewed: 07/26/2016 Elsevier Interactive Patient Education  2018 [employer]. Acute Bronchitis, Adult Acute bronchitis is when air tubes (bronchi) in the lungs suddenly get swollen. The condition can make it hard to breathe. It can also cause these symptoms:  A cough.  Coughing up clear, yellow, or green mucus.  Wheezing.  Chest congestion.  Shortness of breath.  A fever.  Body aches.  Chills.  A sore throat.  Follow these instructions at home: Medicines  Take over-the-counter and prescription medicines only as told by your doctor.  If you were prescribed an antibiotic medicine, take it as told by  your doctor. Do not stop taking the antibiotic even if you start to feel better. General instructions  Rest.  Drink enough fluids to keep your pee (urine) clear or pale yellow.  Avoid smoking and secondhand smoke. If you smoke and you need help quitting, ask your doctor. Quitting will help your lungs heal faster.  Use an inhaler, cool mist vaporizer, or humidifier as told by your doctor.  Keep all follow-up visits as told by your doctor. This is important. How is this prevented? To lower your risk of getting this condition again:  Wash your hands often with soap and water. If you cannot use soap and water, use hand sanitizer.  Avoid contact with people who have cold symptoms.  Try not to touch your hands to your mouth, nose, or eyes.  Make sure to get the flu shot every year.  Contact a doctor if:  Your symptoms do not get better in 2 weeks. Get help right away if:  You cough up blood.  You have chest pain.  You have very bad shortness of breath.  You become dehydrated.  You faint (pass out) or keep  feeling like you are going to pass out.  You keep throwing up (vomiting).  You have a very bad headache.  Your fever or chills gets worse. This information is not intended to replace advice given to you by your health care provider. Make sure you discuss any questions you have with your health care provider. Document Released: 04/15/2008 Document Revised: 06/05/2016 Document Reviewed: 04/17/2016 Elsevier Interactive Patient Education  2018 [employer]. Otitis Media, Adult Otitis media is redness, soreness, and puffiness (swelling) in the space just behind your eardrum (middle ear). It may be caused by allergies or infection. It often happens along with a cold. Follow these instructions at home:  Take your medicine as told. Finish it even if you start to feel better.  Only take over-the-counter or prescription medicines for pain, discomfort, or fever as told by your doctor.  Follow up with your doctor as told. Contact a doctor if:  You have otitis media only in one ear, or bleeding from your nose, or both.  You notice a lump on your neck.  You are not getting better in 3-5 days.  You feel worse instead of better. Get help right away if:  You have pain that is not helped with medicine.  You have puffiness, redness, or pain around your ear.  You get a stiff neck.  You cannot move part of your face (paralysis).  You notice that the bone behind your ear hurts when you touch it. This information is not intended to replace advice given to you by your health care provider. Make sure you discuss any questions you have with your health care provider. Document Released: 04/15/2008 Document Revised: 04/04/2016 Document Reviewed: 05/25/2013 Elsevier Interactive Patient Education  2017 [employer]. Allergies An allergy is when your body reacts to a substance in a way that is not normal. An allergic reaction can happen after you:  Eat something.  Breathe in something.  Touch  something.  You can be allergic to:  Things that are only around during certain seasons, like molds and pollens.  Foods.  Drugs.  Insects.  Animal dander.  What are the signs or symptoms?  Puffiness (swelling). This may happen on the lips, face, tongue, mouth, or throat.  Sneezing.  Coughing.  Breathing loudly (wheezing).  Stuffy nose.  Tingling in the mouth.  A rash.  Itching.  Itchy, red, puffy areas of skin (hives).  Watery eyes.  Throwing up (vomiting).  Watery poop (diarrhea).  Dizziness.  Feeling faint or fainting.  Trouble breathing or swallowing.  A tight feeling in the chest.  A fast heartbeat. How is this diagnosed? Allergies can be diagnosed with:  A medical and family history.  Skin tests.  Blood tests.  A food diary. A food diary is a record of all the foods, drinks, and symptoms you have each day.  The results of an elimination diet. This diet involves making sure not to eat certain foods and then seeing what happens when you start eating them again.  How is this treated? There is no cure for allergies, but allergic reactions can be treated with medicine. Severe reactions usually need to be treated at a hospital. How is this prevented? The best way to prevent an allergic reaction is to avoid the thing you are allergic to. Allergy shots and medicines can also help prevent reactions in some cases. This information is not intended to replace advice given to you by your health care provider. Make sure you discuss any questions you have with your health care provider. Document Released: 02/22/2013 Document Revised: 06/24/2016 Document Reviewed: 08/09/2014 Elsevier Interactive Patient Education  2018 [employer] [employer]. Amoxicillin; Clavulanic Acid tablets What is this medicine? AMOXICILLIN; CLAVULANIC ACID (a mox i SIL in; KLAV yoo lan ic AS id) is a penicillin antibiotic. It is used to treat certain kinds of bacterial infections. It will not  work for colds, flu, or other viral infections. This medicine may be used for other purposes; ask your health care provider or pharmacist if you have questions. COMMON BRAND NAME(S): Augmentin What should I tell my health care provider before I take this medicine? They need to know if you have any of these conditions: -bowel disease, like colitis -kidney disease -liver disease -mononucleosis -an unusual or allergic reaction to amoxicillin, penicillin, cephalosporin, other antibiotics, clavulanic acid, other medicines, foods, dyes, or preservatives -pregnant or trying to get pregnant -breast-feeding How should I use this medicine? Take this medicine by mouth with a full glass of water. Follow the directions on the prescription label. Take at the start of a meal. Do not crush or chew. If the tablet has a score line, you may cut it in half at the score line for easier swallowing. Take your medicine at regular intervals. Do not take your medicine more often than directed. Take all of your medicine as directed even if you think you are better. Do not skip doses or stop your medicine early. Talk to your pediatrician regarding the use of this medicine in children. Special care may be needed. Overdosage: If you think you have taken too much of this medicine contact a poison control center or emergency room at once. NOTE: This medicine is only for you. Do not share this medicine with others. What if I miss a dose? If you miss a dose, take it as soon as you can. If it is almost time for your next dose, take only that dose. Do not take double or extra doses. What may interact with this medicine? -allopurinol -anticoagulants -birth control pills -methotrexate -probenecid This list may not describe all possible interactions. Give your health care provider a list of all the medicines, herbs, non-prescription drugs, or dietary supplements you use. Also tell them if you smoke, drink alcohol, or use illegal  drugs. Some items may interact with your medicine. What should I watch  for while using this medicine? Tell your doctor or health care professional if your symptoms do not improve. Do not treat diarrhea with over the counter products. Contact your doctor if you have diarrhea that lasts more than 2 days or if it is severe and watery. If you have diabetes, you may get a false-positive result for sugar in your urine. Check with your doctor or health care professional. Birth control pills may not work properly while you are taking this medicine. Talk to your doctor about using an extra method of birth control. What side effects may I notice from receiving this medicine? Side effects that you should report to your doctor or health care professional as soon as possible: -allergic reactions like skin rash, itching or hives, swelling of the face, lips, or tongue -breathing problems -dark urine -fever or chills, sore throat -redness, blistering, peeling or loosening of the skin, including inside the mouth -seizures -trouble passing urine or change in the amount of urine -unusual bleeding, bruising -unusually weak or tired -white patches or sores in the mouth or throat Side effects that usually do not require medical attention (report to your doctor or health care professional if they continue or are bothersome): -diarrhea -dizziness -headache -nausea, vomiting -stomach upset -vaginal or anal irritation This list may not describe all possible side effects. Call your doctor for medical advice about side effects. You may report side effects to FDA at 1-800-FDA-1088. Where should I keep my medicine? Keep out of the reach of children. Store at room temperature below 25 degrees C (77 degrees F). Keep container tightly closed. Throw away any unused medicine after the expiration date. NOTE: This sheet is a summary. It may not cover all possible information. If you have questions about this medicine, talk  to your doctor, pharmacist, or health care provider.  2018 Elsevier/Gold Standard (2008-01-21 12:04:30) Prednisone tablets What is this medicine? PREDNISONE (PRED ni sone) is a corticosteroid. It is commonly used to treat inflammation of the skin, joints, lungs, and other organs. Common conditions treated include asthma, allergies, and arthritis. It is also used for other conditions, such as blood disorders and diseases of the adrenal glands. This medicine may be used for other purposes; ask your health care provider or pharmacist if you have questions. COMMON BRAND NAME(S): Deltasone, Predone, Sterapred, Sterapred DS What should I tell my health care provider before I take this medicine? They need to know if you have any of these conditions: -Cushing's syndrome -diabetes -glaucoma -heart disease -high blood pressure -infection (especially a virus infection such as chickenpox, cold sores, or herpes) -kidney disease -liver disease -mental illness -myasthenia gravis -osteoporosis -seizures -stomach or intestine problems -thyroid disease -an unusual or allergic reaction to lactose, prednisone, other medicines, foods, dyes, or preservatives -pregnant or trying to get pregnant -breast-feeding How should I use this medicine? Take this medicine by mouth with a glass of water. Follow the directions on the prescription label. Take this medicine with food. If you are taking this medicine once a day, take it in the morning. Do not take more medicine than you are told to take. Do not suddenly stop taking your medicine because you may develop a severe reaction. Your doctor will tell you how much medicine to take. If your doctor wants you to stop the medicine, the dose may be slowly lowered over time to avoid any side effects. Talk to your pediatrician regarding the use of this medicine in children. Special care may be needed. Overdosage: If you  think you have taken too much of this medicine contact  a poison control center or emergency room at once. NOTE: This medicine is only for you. Do not share this medicine with others. What if I miss a dose? If you miss a dose, take it as soon as you can. If it is almost time for your next dose, talk to your doctor or health care professional. You may need to miss a dose or take an extra dose. Do not take double or extra doses without advice. What may interact with this medicine? Do not take this medicine with any of the following medications: -metyrapone -mifepristone This medicine may also interact with the following medications: -aminoglutethimide -amphotericin B -aspirin and aspirin-like medicines -barbiturates -certain medicines for diabetes, like glipizide or glyburide -cholestyramine -cholinesterase inhibitors -cyclosporine -digoxin -diuretics -ephedrine -female hormones, like estrogens and birth control pills -isoniazid -ketoconazole -NSAIDS, medicines for pain and inflammation, like ibuprofen or naproxen -phenytoin -rifampin -toxoids -vaccines -warfarin This list may not describe all possible interactions. Give your health care provider a list of all the medicines, herbs, non-prescription drugs, or dietary supplements you use. Also tell them if you smoke, drink alcohol, or use illegal drugs. Some items may interact with your medicine. What should I watch for while using this medicine? Visit your doctor or health care professional for regular checks on your progress. If you are taking this medicine over a prolonged period, carry an identification card with your name and address, the type and dose of your medicine, and your doctor's name and address. This medicine may increase your risk of getting an infection. Tell your doctor or health care professional if you are around anyone with measles or chickenpox, or if you develop sores or blisters that do not heal properly. If you are going to have surgery, tell your doctor or health  care professional that you have taken this medicine within the last twelve months. Ask your doctor or health care professional about your diet. You may need to lower the amount of salt you eat. This medicine may affect blood sugar levels. If you have diabetes, check with your doctor or health care professional before you change your diet or the dose of your diabetic medicine. What side effects may I notice from receiving this medicine? Side effects that you should report to your doctor or health care professional as soon as possible: -allergic reactions like skin rash, itching or hives, swelling of the face, lips, or tongue -changes in emotions or moods -changes in vision -depressed mood -eye pain -fever or chills, cough, sore throat, pain or difficulty passing urine -increased thirst -swelling of ankles, feet Side effects that usually do not require medical attention (report to your doctor or health care professional if they continue or are bothersome): -confusion, excitement, restlessness -headache -nausea, vomiting -skin problems, acne, thin and shiny skin -trouble sleeping -weight gain This list may not describe all possible side effects. Call your doctor for medical advice about side effects. You may report side effects to FDA at 1-800-FDA-1088. Where should I keep my medicine? Keep out of the reach of children. Store at room temperature between 15 and 30 degrees C (59 and 86 degrees F). Protect from light. Keep container tightly closed. Throw away any unused medicine after the expiration date. NOTE: This sheet is a summary. It may not cover all possible information. If you have questions about this medicine, talk to your doctor, pharmacist, or health care provider.  2018 Elsevier/Gold Standard (2011-06-13 10:57:14)

## 2018-05-04 NOTE — Progress Notes (Signed)
Subjective:     Patient ID: Gail Barber, female   DOB: August 23, 1961, 57 y.o.   MRN: [id number]  HPI   Blood pressure 138/85, pulse 78, temperature 98.7 F (37.1 C), temperature source Tympanic, resp. rate 18, weight 143 lb 12.8 oz (65.2 kg), SpO2 98 %.  Patient is a 57 year old female in no acute distress who comes in to the clinic for cough and congestion. Ear fullness/ pressure. Fatigue. She has not been able to sleep with cough at night she reports.   She has chest congestion and cough. She reports mild wheezing at home worse at night.  She reports symptoms started last Friday.  She also reports itchy ears, throat and nose.   She has been having sweats at night which she attributes to menopause. Denies any pain with breathing.   Denies any chance of pregnancy.   Husband was also recently sick.   Patient denies any  body aches,chills, rash, chest pain, shortness of breath, nausea, vomiting, or diarrhea.    Review of Systems  Constitutional: Positive for fatigue. Negative for activity change, appetite change, chills, diaphoresis, fever and unexpected weight change.  HENT: Positive for congestion, sneezing and sore throat. Negative for dental problem, drooling, ear discharge, ear pain (pressure bilateral ears and " itching".), facial swelling, hearing loss, mouth sores, nosebleeds, postnasal drip, rhinorrhea, sinus pressure, sinus pain, tinnitus, trouble swallowing and voice change.   Respiratory: Positive for cough and wheezing. Negative for apnea, choking, chest tightness, shortness of breath and stridor.   Cardiovascular: Negative.   Gastrointestinal: Negative.   Endocrine:       Diabetic - denies any  hyperglycemia or hypoglycemia.   Genitourinary: Negative.   Musculoskeletal: Negative.   Skin: Negative.   Allergic/Immunologic:        -- Tylenol (Acetaminophen) -- Other (See Comments)   Neurological: Negative.   Hematological: Negative.   Psychiatric/Behavioral: Negative.         Objective:   Physical Exam  Constitutional: She is oriented to person, place, and time. Vital signs are normal. She appears well-developed and well-nourished. She is active.  Non-toxic appearance. She does not have a sickly appearance. She does not appear ill. No distress. She is not intubated.  HENT:  Head: Normocephalic and atraumatic.  Right Ear: Hearing and external ear normal. No tenderness. No foreign bodies. Tympanic membrane is erythematous. Tympanic membrane is not perforated. A middle ear effusion (dark yellow fluid behind tympanic membrane ) is present.  Left Ear: Hearing, external ear and ear canal normal. Tympanic membrane is not perforated and not erythematous. A middle ear effusion (clear fluid behind tympanic membrane ) is present.  Nose: Mucosal edema and rhinorrhea present. Right sinus exhibits no maxillary sinus tenderness and no frontal sinus tenderness. Left sinus exhibits no maxillary sinus tenderness and no frontal sinus tenderness.  Mouth/Throat: Uvula is midline and mucous membranes are normal. No uvula swelling. Posterior oropharyngeal erythema present. No oropharyngeal exudate, posterior oropharyngeal edema or tonsillar abscesses. No tonsillar exudate.  Eyes: Pupils are equal, round, and reactive to light. Conjunctivae, EOM and lids are normal. Right eye exhibits no discharge. Left eye exhibits no discharge. No scleral icterus.  Neck: Trachea normal, normal range of motion, full passive range of motion without pain and phonation normal. Neck supple. No JVD present. No tracheal tenderness present. No tracheal deviation present.  Cardiovascular: Normal rate, regular rhythm, normal heart sounds and intact distal pulses. Exam reveals no gallop and no friction rub.  No murmur heard. Pulmonary/Chest:  Effort normal. No accessory muscle usage or stridor. No apnea, no tachypnea and no bradypnea. She is not intubated. No respiratory distress. She has wheezes in the right upper  field and the left upper field. She has no rales. She exhibits no tenderness.  Abdominal: Soft. Bowel sounds are normal.  Musculoskeletal: Normal range of motion.  Lymphadenopathy:    She has no cervical adenopathy.  Neurological: She is alert and oriented to person, place, and time. She displays normal reflexes. No cranial nerve deficit. She exhibits normal muscle tone. Coordination normal.  Skin: Skin is warm and dry. Capillary refill takes less than 2 seconds. No rash noted. She is not diaphoretic. No erythema. No pallor.  Psychiatric: She has a normal mood and affect. Her behavior is normal. Judgment and thought content normal.  Vitals reviewed.      Assessment:     Non-recurrent acute serous otitis media of right ear  Bronchitis      Plan:         Meds ordered this encounter  Medications  . predniSONE (STERAPRED UNI-PAK 21 TAB) 10 MG (21) TBPK tablet    Sig: PO: Take 6 tablets on day 1:Take 5 tablets day 2:Take 4 tablets day 3: Take 3 tablets day 4:Take 2 tablets day five: 5 Take 1 tablet day 6    Dispense:  21 tablet    Refill:  0  . amoxicillin-clavulanate (AUGMENTIN) 875-125 MG tablet    Sig: Take 1 tablet by mouth 2 (two) times daily.    Dispense:  20 tablet    Refill:  0  . loratadine (CLARITIN) 10 MG tablet    Sig: Take 1 tablet (10 mg total) by mouth daily.    Dispense:  30 tablet    Refill:  3  . fluticasone (FLONASE) 50 MCG/ACT nasal spray    Sig: Place 2 sprays into both nostrils daily.    Dispense:  16 g    Refill:  3    Discontinue previous orders for Flonase- keep this one only. Thanks call [phone] if needed for questions.   Recommend restarting allergy medications Claritin and Flonase as prescribed daily since patient has discontinued them.   Discussed Prednisone effects on glucose and to call PCP if hyperglycemia uncontrolled for further instructions.   She declines Albuterol inhaler.   Diabetic Tussin for cough over the counter as per  package instructions.   Advised patient call the office or your primary care doctor for an appointment if no improvement within 72 hours or if any symptoms change or worsen at any time  Advised ER or urgent Care if after hours or on weekend. Call 911 for emergency symptoms at any time.Patinet verbalized understanding of all instructions given/reviewed and treatment plan and has no further questions or concerns at this time.    Patient verbalized understanding of all instructions given and denies any further questions at this time.

## 2018-06-03 ENCOUNTER — Other Ambulatory Visit: Payer: Self-pay

## 2018-06-03 DIAGNOSIS — E119 Type 2 diabetes mellitus without complications: Secondary | ICD-10-CM

## 2018-06-03 DIAGNOSIS — R808 Other proteinuria: Secondary | ICD-10-CM

## 2018-06-03 DIAGNOSIS — E1129 Type 2 diabetes mellitus with other diabetic kidney complication: Secondary | ICD-10-CM

## 2018-06-03 DIAGNOSIS — Z794 Long term (current) use of insulin: Secondary | ICD-10-CM

## 2018-06-03 DIAGNOSIS — R809 Proteinuria, unspecified: Principal | ICD-10-CM

## 2018-06-04 LAB — COMPREHENSIVE METABOLIC PANEL
ALBUMIN: 4.5 g/dL (ref 3.5–5.5)
ALT: 25 IU/L (ref 0–32)
AST: 22 IU/L (ref 0–40)
Albumin/Globulin Ratio: 1.5 (ref 1.2–2.2)
Alkaline Phosphatase: 220 IU/L — ABNORMAL HIGH (ref 39–117)
BILIRUBIN TOTAL: 0.3 mg/dL (ref 0.0–1.2)
BUN / CREAT RATIO: 27 — AB (ref 9–23)
BUN: 20 mg/dL (ref 6–24)
CHLORIDE: 100 mmol/L (ref 96–106)
CO2: 21 mmol/L (ref 20–29)
Calcium: 9.6 mg/dL (ref 8.7–10.2)
Creatinine, Ser: 0.75 mg/dL (ref 0.57–1.00)
GFR, EST AFRICAN AMERICAN: 103 mL/min/{1.73_m2} (ref >59)
GFR, EST NON AFRICAN AMERICAN: 89 mL/min/{1.73_m2} (ref >59)
GLUCOSE: 205 mg/dL — AB (ref 65–99)
Globulin, Total: 3 g/dL (ref 1.5–4.5)
Potassium: 4.2 mmol/L (ref 3.5–5.2)
Sodium: 139 mmol/L (ref 134–144)
TOTAL PROTEIN: 7.5 g/dL (ref 6.0–8.5)

## 2018-06-04 LAB — HGB A1C W/O EAG: Hgb A1c MFr Bld: 10.5 % — ABNORMAL HIGH (ref 4.8–5.6)

## 2018-06-04 LAB — LIPID PANEL
CHOLESTEROL TOTAL: 174 mg/dL (ref 100–199)
Chol/HDL Ratio: 3.8 ratio (ref 0.0–4.4)
HDL: 46 mg/dL (ref >39)
LDL Calculated: 68 mg/dL (ref 0–99)
Triglycerides: 300 mg/dL — ABNORMAL HIGH (ref 0–149)
VLDL CHOLESTEROL CAL: 60 mg/dL — AB (ref 5–40)

## 2018-06-04 LAB — MICROALBUMIN / CREATININE URINE RATIO
Creatinine, Urine: 92.6 mg/dL
MICROALB/CREAT RATIO: 397.7 mg/g{creat} — AB (ref 0.0–30.0)
Microalbumin, Urine: 368.3 ug/mL

## 2018-09-16 ENCOUNTER — Other Ambulatory Visit: Payer: Self-pay

## 2018-09-16 DIAGNOSIS — E119 Type 2 diabetes mellitus without complications: Secondary | ICD-10-CM

## 2018-09-16 DIAGNOSIS — Z794 Long term (current) use of insulin: Secondary | ICD-10-CM

## 2018-09-17 LAB — SPECIMEN STATUS REPORT

## 2018-09-18 LAB — LIPID PANEL
CHOL/HDL RATIO: 3.3 ratio (ref 0.0–4.4)
Cholesterol, Total: 189 mg/dL (ref 100–199)
HDL: 58 mg/dL (ref >39)
LDL CALC: 102 mg/dL — AB (ref 0–99)
TRIGLYCERIDES: 147 mg/dL (ref 0–149)
VLDL Cholesterol Cal: 29 mg/dL (ref 5–40)

## 2018-09-18 LAB — COMPREHENSIVE METABOLIC PANEL
ALBUMIN: 4.7 g/dL (ref 3.5–5.5)
ALT: 31 IU/L (ref 0–32)
AST: 27 IU/L (ref 0–40)
Albumin/Globulin Ratio: 1.7 (ref 1.2–2.2)
Alkaline Phosphatase: 216 IU/L — ABNORMAL HIGH (ref 39–117)
BUN/Creatinine Ratio: 25 — ABNORMAL HIGH (ref 9–23)
BUN: 19 mg/dL (ref 6–24)
Bilirubin Total: 0.5 mg/dL (ref 0.0–1.2)
CALCIUM: 10.1 mg/dL (ref 8.7–10.2)
CO2: 19 mmol/L — ABNORMAL LOW (ref 20–29)
CREATININE: 0.76 mg/dL (ref 0.57–1.00)
Chloride: 98 mmol/L (ref 96–106)
GFR, EST AFRICAN AMERICAN: 101 mL/min/{1.73_m2} (ref >59)
GFR, EST NON AFRICAN AMERICAN: 88 mL/min/{1.73_m2} (ref >59)
GLOBULIN, TOTAL: 2.7 g/dL (ref 1.5–4.5)
Glucose: 188 mg/dL — ABNORMAL HIGH (ref 65–99)
Potassium: 4.2 mmol/L (ref 3.5–5.2)
SODIUM: 138 mmol/L (ref 134–144)
TOTAL PROTEIN: 7.4 g/dL (ref 6.0–8.5)

## 2018-09-18 LAB — HGB A1C W/O EAG

## 2018-09-18 LAB — MICROALBUMIN / CREATININE URINE RATIO
CREATININE, UR: 82.2 mg/dL
Microalb/Creat Ratio: 321.7 mg/g creat — ABNORMAL HIGH (ref 0.0–30.0)
Microalbumin, Urine: 264.4 ug/mL

## 2018-09-18 NOTE — Addendum Note (Signed)
Addended by: Jacqulynn Cadet on: 09/18/2018 01:12 PM   Modules accepted: Orders

## 2018-09-22 ENCOUNTER — Other Ambulatory Visit: Payer: Self-pay

## 2018-09-22 DIAGNOSIS — Z794 Long term (current) use of insulin: Secondary | ICD-10-CM

## 2018-09-23 LAB — HEMOGLOBIN A1C
ESTIMATED AVERAGE GLUCOSE: 237 mg/dL
Hgb A1c MFr Bld: 9.9 % — ABNORMAL HIGH (ref 4.8–5.6)

## 2018-10-06 ENCOUNTER — Other Ambulatory Visit: Payer: Self-pay

## 2018-10-06 DIAGNOSIS — Z0000 Encounter for general adult medical examination without abnormal findings: Secondary | ICD-10-CM

## 2018-10-06 DIAGNOSIS — I10 Essential (primary) hypertension: Secondary | ICD-10-CM

## 2018-10-07 LAB — CBC WITH DIFFERENTIAL/PLATELET
BASOS: 1 %
Basophils Absolute: 0.1 10*3/uL (ref 0.0–0.2)
EOS (ABSOLUTE): 0.1 10*3/uL (ref 0.0–0.4)
Eos: 2 %
Hematocrit: 35.9 % (ref 34.0–46.6)
Hemoglobin: 12.1 g/dL (ref 11.1–15.9)
IMMATURE GRANS (ABS): 0 10*3/uL (ref 0.0–0.1)
Immature Granulocytes: 0 %
Lymphocytes Absolute: 1.5 10*3/uL (ref 0.7–3.1)
Lymphs: 21 %
MCH: 24.5 pg — AB (ref 26.6–33.0)
MCHC: 33.7 g/dL (ref 31.5–35.7)
MCV: 73 fL — AB (ref 79–97)
MONOS ABS: 0.4 10*3/uL (ref 0.1–0.9)
Monocytes: 6 %
NEUTROS ABS: 5.2 10*3/uL (ref 1.4–7.0)
Neutrophils: 70 %
Platelets: 329 10*3/uL (ref 150–450)
RBC: 4.93 x10E6/uL (ref 3.77–5.28)
RDW: 14.1 % (ref 12.3–15.4)
WBC: 7.3 10*3/uL (ref 3.4–10.8)

## 2018-10-12 DIAGNOSIS — E1159 Type 2 diabetes mellitus with other circulatory complications: Secondary | ICD-10-CM | POA: Insufficient documentation

## 2018-10-12 DIAGNOSIS — R809 Proteinuria, unspecified: Secondary | ICD-10-CM

## 2018-10-12 DIAGNOSIS — E1129 Type 2 diabetes mellitus with other diabetic kidney complication: Secondary | ICD-10-CM | POA: Insufficient documentation

## 2018-10-12 DIAGNOSIS — E1165 Type 2 diabetes mellitus with hyperglycemia: Secondary | ICD-10-CM | POA: Insufficient documentation

## 2018-10-12 DIAGNOSIS — Z794 Long term (current) use of insulin: Secondary | ICD-10-CM

## 2018-10-12 DIAGNOSIS — I10 Essential (primary) hypertension: Secondary | ICD-10-CM

## 2018-11-09 ENCOUNTER — Ambulatory Visit: Payer: Self-pay | Admitting: Adult Health

## 2018-11-09 ENCOUNTER — Encounter: Payer: Self-pay | Admitting: Adult Health

## 2018-11-09 VITALS — BP 178/83 | HR 87 | Temp 98.8°F | Wt 147.0 lb

## 2018-11-09 DIAGNOSIS — Z0000 Encounter for general adult medical examination without abnormal findings: Secondary | ICD-10-CM

## 2018-11-09 DIAGNOSIS — J069 Acute upper respiratory infection, unspecified: Secondary | ICD-10-CM

## 2018-11-09 DIAGNOSIS — H6502 Acute serous otitis media, left ear: Secondary | ICD-10-CM

## 2018-11-09 MED ORDER — AMOXICILLIN 875 MG PO TABS: 875.0000 mg | ORAL_TABLET | Freq: Two times a day (BID) | ORAL | 0 refills | Status: DC

## 2018-11-09 MED ORDER — FLUTICASONE PROPIONATE 50 MCG/ACT NA SUSP: 2.0000 | Freq: Every day | NASAL | 3 refills | Status: DC

## 2018-11-09 NOTE — Progress Notes (Addendum)
Subjective:     Patient ID: Gail Barber, female   DOB: [birth year], 57 y.o.   MRN: [id number]  Blood pressure (!) 178/83, pulse 87, temperature 98.8 F (37.1 C), weight 147 lb (66.7 kg), SpO2 98 %. She has been using Sudafed advised to discontinue as can cause hypertension she will monitor and report to her primary care provider.  URI   This is a new problem. The current episode started in the past 7 days (12/28 ). Associated symptoms include congestion, coughing, rhinorrhea and sneezing. Pertinent negatives include no abdominal pain, chest pain, diarrhea, dysuria, ear pain (ear itching ), headaches, joint pain, joint swelling, nausea, neck pain, plugged ear sensation, rash, sinus pain, sore throat, swollen glands, vomiting or wheezing. Treatments tried: mucinex  The treatment provided no relief.    Patient is a 57 year old female in no acute distress who comes to the clinic for upper respiratory symptoms started 2 days ago. Ear itching and throat itching.    Patient  denies any fever, body aches,chills, rash, chest pain, shortness of breath, nausea, vomiting, or diarrhea.        Allergies  Allergen Reactions  . Tylenol [Acetaminophen] Other (See Comments)     Review of Systems  Constitutional: Negative for activity change, appetite change, chills, diaphoresis, fatigue, fever and unexpected weight change.  HENT: Positive for congestion, rhinorrhea and sneezing. Negative for dental problem, drooling, ear discharge, ear pain (ear itching ), facial swelling, hearing loss, mouth sores, nosebleeds, postnasal drip, sinus pressure, sinus pain, sore throat, tinnitus, trouble swallowing and voice change.   Eyes: Negative.   Respiratory: Positive for cough. Negative for apnea, choking, chest tightness, shortness of breath, wheezing and stridor.   Cardiovascular: Negative.  Negative for chest pain.  Gastrointestinal: Negative.  Negative for abdominal pain, diarrhea, nausea and vomiting.   Endocrine:       Insulin dependent diabetic   Genitourinary: Negative.  Negative for dysuria.  Musculoskeletal: Negative for joint pain and neck pain.  Skin: Negative.  Negative for rash.  Neurological: Negative.  Negative for headaches.  Hematological: Negative.   Psychiatric/Behavioral: Negative.        Objective:   Physical Exam Vitals signs and nursing note reviewed.  Constitutional:      Appearance: She is well-developed. She is obese.  HENT:     Head: Normocephalic and atraumatic.     Right Ear: Tympanic membrane and ear canal normal.     Left Ear: Tenderness present. No drainage or swelling. A middle ear effusion is present. Tympanic membrane is erythematous.     Nose: Mucosal edema (mild bilaterally ), congestion and rhinorrhea present. No nasal deformity, septal deviation, signs of injury, laceration or nasal tenderness. Rhinorrhea is clear.     Right Sinus: No maxillary sinus tenderness or frontal sinus tenderness.     Left Sinus: No maxillary sinus tenderness or frontal sinus tenderness.     Mouth/Throat:     Mouth: Mucous membranes are moist. No oral lesions.     Pharynx: No pharyngeal swelling, oropharyngeal exudate, posterior oropharyngeal erythema or uvula swelling.  Eyes:     Extraocular Movements:     Right eye: Normal extraocular motion.     Left eye: Normal extraocular motion.     Conjunctiva/sclera: Conjunctivae normal.     Pupils: Pupils are equal, round, and reactive to light.  Neck:     Musculoskeletal: Full passive range of motion without pain, normal range of motion and neck supple.     Thyroid:  No thyromegaly.     Trachea: Trachea and phonation normal.  Cardiovascular:     Rate and Rhythm: Normal rate and regular rhythm.     Heart sounds: Normal heart sounds. No murmur. No friction rub. No gallop.   Pulmonary:     Effort: Pulmonary effort is normal. No respiratory distress.     Breath sounds: Normal breath sounds. No stridor. No wheezing, rhonchi  or rales.  Chest:     Chest wall: No tenderness.  Abdominal:     Palpations: Abdomen is soft.  Lymphadenopathy:     Head:     Right side of head: No submental, submandibular, tonsillar, preauricular, posterior auricular or occipital adenopathy.     Left side of head: No submental, submandibular, tonsillar, preauricular, posterior auricular or occipital adenopathy.     Cervical: No cervical adenopathy.  Skin:    General: Skin is warm and dry.     Capillary Refill: Capillary refill takes less than 2 seconds.  Neurological:     Mental Status: She is alert.  Psychiatric:        Mood and Affect: Mood normal.        Assessment:     Non-recurrent acute serous otitis media of left ear  Viral upper respiratory tract infection     Plan:     Meds ordered this encounter  Medications  . fluticasone (FLONASE) 50 MCG/ACT nasal spray    Sig: Place 2 sprays into both nostrils daily.    Dispense:  16 g    Refill:  3    Discontinue previous orders for Flonase- keep this one only. Thanks call [phone] if needed for questions.  Marland Kitchen amoxicillin (AMOXIL) 875 MG tablet    Sig: Take 1 tablet (875 mg total) by mouth 2 (two) times daily.    Dispense:  20 tablet    Refill:  0    Antibiotic for otitis media left ear otherwise viral upper respiratory infection.   Gave and reviewed After Visit Summary(AVS) with patient.   Advised patient call the office or your primary care doctor for an appointment if no improvement within 72 hours or if any symptoms change or worsen at any time  Advised ER or urgent Care if after hours or on weekend. Call 911 for emergency symptoms at any time.Patinet verbalized understanding of all instructions given/reviewed and treatment plan and has no further questions or concerns at this time.    Patient verbalized understanding of all instructions given and denies any further questions at this time.

## 2018-11-09 NOTE — Patient Instructions (Signed)
Upper Respiratory Infection, Adult An upper respiratory infection (URI) affects the nose, throat, and upper air passages. URIs are caused by germs (viruses). The most common type of URI is often called "the common cold." Medicines cannot cure URIs, but you can do things at home to relieve your symptoms. URIs usually get better within 7-10 days. Follow these instructions at home: Activity  Rest as needed.  If you have a fever, stay home from work or school until your fever is gone, or until your doctor says you may return to work or school. ? You should stay home until you cannot spread the infection anymore (you are not contagious). ? Your doctor may have you wear a face mask so you have less risk of spreading the infection. Relieving symptoms  Gargle with a salt-water mixture 3-4 times a day or as needed. To make a salt-water mixture, completely dissolve -1 tsp of salt in 1 cup of warm water.  Use a cool-mist humidifier to add moisture to the air. This can help you breathe more easily. Eating and drinking   Drink enough fluid to keep your pee (urine) pale yellow.  Eat soups and other clear broths. General instructions   Take over-the-counter and prescription medicines only as told by your doctor. These include cold medicines, fever reducers, and cough suppressants.  Do not use any products that contain nicotine or tobacco. These include cigarettes and e-cigarettes. If you need help quitting, ask your doctor.  Avoid being where people are smoking (avoid secondhand smoke).  Make sure you get regular shots and get the flu shot every year.  Keep all follow-up visits as told by your doctor. This is important. How to avoid spreading infection to others   Wash your hands often with soap and water. If you do not have soap and water, use hand sanitizer.  Avoid touching your mouth, face, eyes, or nose.  Cough or sneeze into a tissue or your sleeve or elbow. Do not cough or sneeze  into your hand or into the air. Contact a doctor if:  You are getting worse, not better.  You have any of these: ? A fever. ? Chills. ? Brown or red mucus in your nose. ? Yellow or brown fluid (discharge)coming from your nose. ? Pain in your face, especially when you bend forward. ? Swollen neck glands. ? Pain with swallowing. ? White areas in the back of your throat. Get help right away if:  You have shortness of breath that gets worse.  You have very bad or constant: ? Headache. ? Ear pain. ? Pain in your forehead, behind your eyes, and over your cheekbones (sinus pain). ? Chest pain.  You have long-lasting (chronic) lung disease along with any of these: ? Wheezing. ? Long-lasting cough. ? Coughing up blood. ? A change in your usual mucus.  You have a stiff neck.  You have changes in your: ? Vision. ? Hearing. ? Thinking. ? Mood. Summary  An upper respiratory infection (URI) is caused by a germ called a virus. The most common type of URI is often called "the common cold."  URIs usually get better within 7-10 days.  Take over-the-counter and prescription medicines only as told by your doctor. This information is not intended to replace advice given to you by your health care provider. Make sure you discuss any questions you have with your health care provider. Document Released: 04/15/2008 Document Revised: 06/20/2017 Document Reviewed: 06/20/2017 Elsevier Interactive Patient Education  2019 [employer].  Otitis Media, Adult  Otitis media means that the middle ear is red and swollen (inflamed) and full of fluid. The condition usually goes away on its own. Follow these instructions at home:  Take over-the-counter and prescription medicines only as told by your doctor.  If you were prescribed an antibiotic medicine, take it as told by your doctor. Do not stop taking the antibiotic even if you start to feel better.  Keep all follow-up visits as told by your  doctor. This is important. Contact a doctor if:  You have bleeding from your nose.  There is a lump on your neck.  You are not getting better in 5 days.  You feel worse instead of better. Get help right away if:  You have pain that is not helped with medicine.  You have swelling, redness, or pain around your ear.  You get a stiff neck.  You cannot move part of your face (paralyzed).  You notice that the bone behind your ear hurts when you touch it.  You get a very bad headache. Summary  Otitis media means that the middle ear is red, swollen, and full of fluid.  This condition usually goes away on its own. In some cases, treatment may be needed.  If you were prescribed an antibiotic medicine, take it as told by your doctor. This information is not intended to replace advice given to you by your health care provider. Make sure you discuss any questions you have with your health care provider. Document Released: 04/15/2008 Document Revised: 11/18/2016 Document Reviewed: 11/18/2016 Elsevier Interactive Patient Education  2019 [employer].

## 2019-03-01 NOTE — Addendum Note (Signed)
Addended by: Jacqulynn Cadet on: 03/01/2019 04:01 PM   Modules accepted: Orders

## 2019-03-02 ENCOUNTER — Other Ambulatory Visit: Payer: Self-pay

## 2019-03-02 DIAGNOSIS — Z0000 Encounter for general adult medical examination without abnormal findings: Secondary | ICD-10-CM

## 2019-03-03 LAB — COMPREHENSIVE METABOLIC PANEL
ALT: 33 IU/L — ABNORMAL HIGH (ref 0–32)
AST: 25 IU/L (ref 0–40)
Albumin/Globulin Ratio: 1.5 (ref 1.2–2.2)
Albumin: 4.6 g/dL (ref 3.8–4.9)
Alkaline Phosphatase: 270 IU/L — ABNORMAL HIGH (ref 39–117)
BUN/Creatinine Ratio: 28 — ABNORMAL HIGH (ref 9–23)
BUN: 23 mg/dL (ref 6–24)
Bilirubin Total: 0.4 mg/dL (ref 0.0–1.2)
CO2: 20 mmol/L (ref 20–29)
Calcium: 10.3 mg/dL — ABNORMAL HIGH (ref 8.7–10.2)
Chloride: 99 mmol/L (ref 96–106)
Creatinine, Ser: 0.83 mg/dL (ref 0.57–1.00)
GFR calc Af Amer: 91 mL/min/{1.73_m2} (ref >59)
GFR calc non Af Amer: 79 mL/min/{1.73_m2} (ref >59)
Globulin, Total: 3.1 g/dL (ref 1.5–4.5)
Glucose: 323 mg/dL — ABNORMAL HIGH (ref 65–99)
Potassium: 4.6 mmol/L (ref 3.5–5.2)
Sodium: 137 mmol/L (ref 134–144)
Total Protein: 7.7 g/dL (ref 6.0–8.5)

## 2019-03-03 LAB — HEMOGLOBIN A1C
Est. average glucose Bld gHb Est-mCnc: 283 mg/dL
Hgb A1c MFr Bld: 11.5 % — ABNORMAL HIGH (ref 4.8–5.6)

## 2019-04-07 ENCOUNTER — Encounter: Payer: BLUE CROSS/BLUE SHIELD | Attending: Surgery | Admitting: Dietician

## 2019-04-07 ENCOUNTER — Other Ambulatory Visit: Payer: Self-pay

## 2019-04-07 ENCOUNTER — Encounter: Payer: Self-pay | Admitting: Dietician

## 2019-04-07 VITALS — Ht 62.0 in | Wt 147.8 lb

## 2019-04-07 DIAGNOSIS — E119 Type 2 diabetes mellitus without complications: Secondary | ICD-10-CM | POA: Diagnosis not present

## 2019-04-07 DIAGNOSIS — Z794 Long term (current) use of insulin: Secondary | ICD-10-CM

## 2019-04-07 NOTE — Progress Notes (Signed)
Medical Nutrition Therapy: Visit start time: 9628  end time: 1615 Assessment:  Diagnosis: Type 2 diabetes Past medical history: HTN Psychosocial issues/ stress concerns: recent high stress level due to mother's death  Preferred learning method:  . Auditory . Visual . Hands-on  Current weight: 147.8lbs  Height: 5'2" Medications, supplements: reconciled list in medical record  Progress and evaluation:   Patient reports recent HbA1C of 11%; was down to 6.5%. She reports increased stress and stress eating since illness and death of her mother 2018/12/24.  She loves seasonal fruits melons, cherries, and nectarines.    Patient works 3rd shift, 5 nights a week, doing physical work  Physical activity: job + cardio and weights, 30 minutes, 5x a week  Dietary Intake:  Usual eating pattern includes 3 meals and 2-3 snacks per day. Dining out frequency: 0-2 meals per week.  Breakfast: "starving" -- when off work -- watermelon; leftovers from dinner; misc. Quick meal * runs errands after breakfast Snack: none (other than on days off; same foods as nighttime snacks) Lunch: sandwich; popcorn; nuts * sleeps 12 or 1pm until 4pm Snack: none Supper: meat + veg ie spaghetti with whole grain pasta + salad *sleeps 7 - 10pm Snack: 3am at work, until [employer], popcorn, fruit; carrots and celery with peanut butter.  Beverages: water, sparkling water; diet Dr. Malachi Bonds  Nutrition Care Education: Topics covered: diabetes Basic nutrition: basic food groups, appropriate nutrient balance, appropriate meal and snack schedule, general nutrition guidelines    Weight control: appropriate caloric intake and food portions Advanced nutrition: food label reading Diabetes: appropriate meal and snack schedule, appropriate carb intake and balance, healthy carb choices; basic meal planning for about 1500kcal with 45% CHO, 25% protein, and 30% fat   Nutritional Diagnosis:  [place]-2.2 Altered nutrition-related laboratory  As related to type 2 diabetes.  As evidenced by patient with recent HbA1C of 11.5%.  Intervention:   Instruction as noted above.  Patient has been working to improve food choices; she plans to work further on a more consistent and better sleep schedule.   Established goals with direction from patient.   Education Materials given:  . General diet guidelines for Diabetes . Plate Planner with food lists . Sample menus . Goals/ instructions  Learner/ who was taught:  . Patient   Level of understanding: Marland Kitchen Verbalizes/ demonstrates competency   Demonstrated degree of understanding via:   Teach back Learning barriers: . None  Willingness to learn/ readiness for change: . Eager, change in progress   Monitoring and Evaluation:  Dietary intake, exercise, BG control, and body weight      follow up: 05/07/19

## 2019-04-07 NOTE — Patient Instructions (Signed)
   Have a light breakfast before going to sleep, such as [place] yogurt with fruit, or toast with boiled eggs or peanut butter.   Work on a schedule that allows for longer sleep time, ideally 7-8 hours to have better restful sleep and a true fasting period.   Control portions of starchy foods such as fruits and popcorn, add some protein like lowfat cheese, low sugar yogurt, or small portions of nuts or peanut butter for balance.

## 2019-05-06 ENCOUNTER — Encounter: Payer: Self-pay | Admitting: Dietician

## 2019-05-06 NOTE — Progress Notes (Signed)
Patient cancelled her appointment scheduled for 05/07/19. An attempt was made to reach her by phone to reschedule, with no answer.

## 2019-05-07 ENCOUNTER — Ambulatory Visit: Payer: BLUE CROSS/BLUE SHIELD | Admitting: Dietician

## 2019-06-03 ENCOUNTER — Encounter: Payer: Self-pay | Admitting: Dietician

## 2019-06-03 NOTE — Progress Notes (Signed)
Have not heard back from patient to reschedule her cancelled appointment from 05/07/19. Sent notification to referring provider.

## 2019-06-28 ENCOUNTER — Other Ambulatory Visit: Payer: Self-pay

## 2019-06-28 DIAGNOSIS — Z20822 Contact with and (suspected) exposure to covid-19: Secondary | ICD-10-CM

## 2019-06-30 LAB — NOVEL CORONAVIRUS, NAA: SARS-CoV-2, NAA: NOT DETECTED

## 2019-08-25 ENCOUNTER — Other Ambulatory Visit: Payer: Self-pay

## 2019-08-25 ENCOUNTER — Ambulatory Visit: Payer: Self-pay

## 2019-08-25 DIAGNOSIS — Z794 Long term (current) use of insulin: Secondary | ICD-10-CM

## 2019-08-25 DIAGNOSIS — Z23 Encounter for immunization: Secondary | ICD-10-CM

## 2019-08-26 ENCOUNTER — Other Ambulatory Visit: Payer: Self-pay

## 2019-09-02 ENCOUNTER — Other Ambulatory Visit: Payer: Self-pay

## 2019-09-02 DIAGNOSIS — Z794 Long term (current) use of insulin: Secondary | ICD-10-CM

## 2019-09-03 LAB — COMPREHENSIVE METABOLIC PANEL
ALT: 41 IU/L — ABNORMAL HIGH (ref 0–32)
AST: 35 IU/L (ref 0–40)
Albumin/Globulin Ratio: 1.5 (ref 1.2–2.2)
Albumin: 4.5 g/dL (ref 3.8–4.9)
Alkaline Phosphatase: 273 IU/L — ABNORMAL HIGH (ref 39–117)
BUN/Creatinine Ratio: 30 — ABNORMAL HIGH (ref 9–23)
BUN: 23 mg/dL (ref 6–24)
Bilirubin Total: 0.4 mg/dL (ref 0.0–1.2)
CO2: 19 mmol/L — ABNORMAL LOW (ref 20–29)
Calcium: 10.2 mg/dL (ref 8.7–10.2)
Chloride: 105 mmol/L (ref 96–106)
Creatinine, Ser: 0.77 mg/dL (ref 0.57–1.00)
GFR calc Af Amer: 99 mL/min/{1.73_m2} (ref >59)
GFR calc non Af Amer: 86 mL/min/{1.73_m2} (ref >59)
Globulin, Total: 3.1 g/dL (ref 1.5–4.5)
Glucose: 142 mg/dL — ABNORMAL HIGH (ref 65–99)
Potassium: 4.4 mmol/L (ref 3.5–5.2)
Sodium: 141 mmol/L (ref 134–144)
Total Protein: 7.6 g/dL (ref 6.0–8.5)

## 2019-09-03 LAB — MICROALBUMIN / CREATININE URINE RATIO

## 2019-09-03 LAB — LIPID PANEL
Chol/HDL Ratio: 3.9 ratio (ref 0.0–4.4)
Cholesterol, Total: 190 mg/dL (ref 100–199)
HDL: 49 mg/dL (ref >39)
LDL Chol Calc (NIH): 111 mg/dL — ABNORMAL HIGH (ref 0–99)
Triglycerides: 172 mg/dL — ABNORMAL HIGH (ref 0–149)
VLDL Cholesterol Cal: 30 mg/dL (ref 5–40)

## 2019-09-03 LAB — HGB A1C W/O EAG: Hgb A1c MFr Bld: 8.3 % — ABNORMAL HIGH (ref 4.8–5.6)

## 2019-09-03 LAB — SPECIMEN STATUS REPORT

## 2019-09-06 ENCOUNTER — Other Ambulatory Visit: Payer: Self-pay

## 2019-09-06 ENCOUNTER — Ambulatory Visit: Payer: Self-pay | Admitting: *Deleted

## 2019-09-06 DIAGNOSIS — Z794 Long term (current) use of insulin: Secondary | ICD-10-CM

## 2019-09-07 LAB — MICROALBUMIN / CREATININE URINE RATIO
Creatinine, Urine: 51.9 mg/dL
Microalb/Creat Ratio: 198 mg/g creat — ABNORMAL HIGH (ref 0–29)
Microalbumin, Urine: 103 ug/mL

## 2019-10-13 ENCOUNTER — Other Ambulatory Visit: Payer: Self-pay

## 2019-10-13 DIAGNOSIS — Z20822 Contact with and (suspected) exposure to covid-19: Secondary | ICD-10-CM

## 2019-10-16 LAB — NOVEL CORONAVIRUS, NAA: SARS-CoV-2, NAA: DETECTED — AB

## 2019-10-17 ENCOUNTER — Telehealth: Payer: Self-pay | Admitting: Unknown Physician Specialty

## 2019-10-17 NOTE — Telephone Encounter (Signed)
Pt is asymptomatic.  Quarantine information given.  Does not qualify for outpatient monoclonal antibody treatment.

## 2019-10-18 ENCOUNTER — Ambulatory Visit: Payer: Self-pay | Admitting: *Deleted

## 2019-10-18 NOTE — Telephone Encounter (Signed)
Caller with questions regarding isolation period after positive Covid19 diagnosed. Reviewed with patient. Going forward 10 days from the onset of symptoms and meeting the no fever/no antipyretics/no worsening respiratory sxs. Encouraged fluids and rest during this time. Will notify [facility].  Reason for Disposition . Health Information question, no triage required and triager able to answer question  Answer Assessment - Initial Assessment Questions 1. REASON FOR CALL or QUESTION: "What is your reason for calling today?" or "How can I best help you?" or "What question do you have that I can help answer?"     Isolation period questions.  Protocols used: INFORMATION ONLY CALL - NO TRIAGE-A-AH

## 2020-01-27 ENCOUNTER — Other Ambulatory Visit: Payer: Self-pay

## 2020-01-27 DIAGNOSIS — I152 Hypertension secondary to endocrine disorders: Secondary | ICD-10-CM

## 2020-01-27 DIAGNOSIS — E1129 Type 2 diabetes mellitus with other diabetic kidney complication: Secondary | ICD-10-CM

## 2020-01-27 DIAGNOSIS — I10 Essential (primary) hypertension: Secondary | ICD-10-CM

## 2020-01-27 DIAGNOSIS — R809 Proteinuria, unspecified: Secondary | ICD-10-CM

## 2020-01-27 DIAGNOSIS — Z794 Long term (current) use of insulin: Secondary | ICD-10-CM

## 2020-01-27 DIAGNOSIS — E1159 Type 2 diabetes mellitus with other circulatory complications: Secondary | ICD-10-CM

## 2020-01-27 NOTE — Addendum Note (Signed)
Addended by: Beckie Salts A on: 01/27/2020 08:15 AM   Modules accepted: Orders

## 2020-01-28 LAB — CBC WITH DIFFERENTIAL/PLATELET
Basophils Absolute: 0.1 10*3/uL (ref 0.0–0.2)
Basos: 1 %
EOS (ABSOLUTE): 0.1 10*3/uL (ref 0.0–0.4)
Eos: 1 %
Hematocrit: 42.6 % (ref 34.0–46.6)
Hemoglobin: 14.1 g/dL (ref 11.1–15.9)
Immature Grans (Abs): 0 10*3/uL (ref 0.0–0.1)
Immature Granulocytes: 0 %
Lymphocytes Absolute: 1.6 10*3/uL (ref 0.7–3.1)
Lymphs: 24 %
MCH: 24.4 pg — ABNORMAL LOW (ref 26.6–33.0)
MCHC: 33.1 g/dL (ref 31.5–35.7)
MCV: 74 fL — ABNORMAL LOW (ref 79–97)
Monocytes Absolute: 0.4 10*3/uL (ref 0.1–0.9)
Monocytes: 6 %
Neutrophils Absolute: 4.6 10*3/uL (ref 1.4–7.0)
Neutrophils: 68 %
Platelets: 320 10*3/uL (ref 150–450)
RBC: 5.78 x10E6/uL — ABNORMAL HIGH (ref 3.77–5.28)
RDW: 15.3 % (ref 11.7–15.4)
WBC: 6.8 10*3/uL (ref 3.4–10.8)

## 2020-01-28 LAB — HEMOGLOBIN A1C
Est. average glucose Bld gHb Est-mCnc: 200 mg/dL
Hgb A1c MFr Bld: 8.6 % — ABNORMAL HIGH (ref 4.8–5.6)

## 2020-01-28 LAB — COMPREHENSIVE METABOLIC PANEL
ALT: 30 IU/L (ref 0–32)
AST: 26 IU/L (ref 0–40)
Albumin/Globulin Ratio: 1.6 (ref 1.2–2.2)
Albumin: 4.7 g/dL (ref 3.8–4.9)
Alkaline Phosphatase: 250 IU/L — ABNORMAL HIGH (ref 39–117)
BUN/Creatinine Ratio: 27 — ABNORMAL HIGH (ref 9–23)
BUN: 22 mg/dL (ref 6–24)
Bilirubin Total: 0.3 mg/dL (ref 0.0–1.2)
CO2: 23 mmol/L (ref 20–29)
Calcium: 10.4 mg/dL — ABNORMAL HIGH (ref 8.7–10.2)
Chloride: 105 mmol/L (ref 96–106)
Creatinine, Ser: 0.81 mg/dL (ref 0.57–1.00)
GFR calc Af Amer: 93 mL/min/{1.73_m2} (ref >59)
GFR calc non Af Amer: 81 mL/min/{1.73_m2} (ref >59)
Globulin, Total: 3 g/dL (ref 1.5–4.5)
Glucose: 97 mg/dL (ref 65–99)
Potassium: 4.2 mmol/L (ref 3.5–5.2)
Sodium: 142 mmol/L (ref 134–144)
Total Protein: 7.7 g/dL (ref 6.0–8.5)

## 2020-01-28 LAB — MICROALBUMIN / CREATININE URINE RATIO
Creatinine, Urine: 61.3 mg/dL
Microalb/Creat Ratio: 185 mg/g creat — ABNORMAL HIGH (ref 0–29)
Microalbumin, Urine: 113.1 ug/mL

## 2020-01-28 LAB — LIPID PANEL WITH LDL/HDL RATIO
Cholesterol, Total: 199 mg/dL (ref 100–199)
HDL: 55 mg/dL (ref >39)
LDL Chol Calc (NIH): 116 mg/dL — ABNORMAL HIGH (ref 0–99)
LDL/HDL Ratio: 2.1 ratio (ref 0.0–3.2)
Triglycerides: 159 mg/dL — ABNORMAL HIGH (ref 0–149)
VLDL Cholesterol Cal: 28 mg/dL (ref 5–40)

## 2020-08-09 ENCOUNTER — Other Ambulatory Visit: Payer: Self-pay

## 2020-08-09 DIAGNOSIS — E1129 Type 2 diabetes mellitus with other diabetic kidney complication: Secondary | ICD-10-CM

## 2020-08-10 ENCOUNTER — Other Ambulatory Visit: Payer: Self-pay

## 2020-08-10 DIAGNOSIS — E1129 Type 2 diabetes mellitus with other diabetic kidney complication: Secondary | ICD-10-CM

## 2020-08-11 LAB — COMPREHENSIVE METABOLIC PANEL
ALT: 33 IU/L — ABNORMAL HIGH (ref 0–32)
AST: 28 IU/L (ref 0–40)
Albumin/Globulin Ratio: 1.5 (ref 1.2–2.2)
Albumin: 4.5 g/dL (ref 3.8–4.9)
Alkaline Phosphatase: 348 IU/L — ABNORMAL HIGH (ref 44–121)
BUN/Creatinine Ratio: 24 — ABNORMAL HIGH (ref 9–23)
BUN: 17 mg/dL (ref 6–24)
Bilirubin Total: 0.6 mg/dL (ref 0.0–1.2)
CO2: 26 mmol/L (ref 20–29)
Calcium: 9.8 mg/dL (ref 8.7–10.2)
Chloride: 101 mmol/L (ref 96–106)
Creatinine, Ser: 0.71 mg/dL (ref 0.57–1.00)
GFR calc Af Amer: 109 mL/min/{1.73_m2} (ref >59)
GFR calc non Af Amer: 94 mL/min/{1.73_m2} (ref >59)
Globulin, Total: 3 g/dL (ref 1.5–4.5)
Glucose: 142 mg/dL — ABNORMAL HIGH (ref 65–99)
Potassium: 4.1 mmol/L (ref 3.5–5.2)
Sodium: 142 mmol/L (ref 134–144)
Total Protein: 7.5 g/dL (ref 6.0–8.5)

## 2020-08-11 LAB — LIPID PANEL
Chol/HDL Ratio: 3.6 ratio (ref 0.0–4.4)
Cholesterol, Total: 195 mg/dL (ref 100–199)
HDL: 54 mg/dL (ref >39)
LDL Chol Calc (NIH): 110 mg/dL — ABNORMAL HIGH (ref 0–99)
Triglycerides: 177 mg/dL — ABNORMAL HIGH (ref 0–149)
VLDL Cholesterol Cal: 31 mg/dL (ref 5–40)

## 2020-08-11 LAB — HGB A1C W/O EAG: Hgb A1c MFr Bld: 10.6 % — ABNORMAL HIGH (ref 4.8–5.6)

## 2020-08-24 ENCOUNTER — Other Ambulatory Visit: Payer: Self-pay | Admitting: Physician Assistant

## 2020-08-24 ENCOUNTER — Other Ambulatory Visit (HOSPITAL_COMMUNITY): Payer: Self-pay | Admitting: Physician Assistant

## 2020-08-24 DIAGNOSIS — R748 Abnormal levels of other serum enzymes: Secondary | ICD-10-CM

## 2020-08-24 DIAGNOSIS — E1159 Type 2 diabetes mellitus with other circulatory complications: Secondary | ICD-10-CM

## 2020-08-24 DIAGNOSIS — Z872 Personal history of diseases of the skin and subcutaneous tissue: Secondary | ICD-10-CM

## 2020-09-06 ENCOUNTER — Ambulatory Visit
Admission: RE | Admit: 2020-09-06 | Discharge: 2020-09-06 | Disposition: A | Discharge status: Home / Self Care | Payer: BC Managed Care – PPO | Source: Ambulatory Visit | Attending: Physician Assistant | Admitting: Physician Assistant

## 2020-09-06 ENCOUNTER — Other Ambulatory Visit: Payer: Self-pay

## 2020-09-06 DIAGNOSIS — R748 Abnormal levels of other serum enzymes: Secondary | ICD-10-CM | POA: Diagnosis present

## 2020-09-06 DIAGNOSIS — Z872 Personal history of diseases of the skin and subcutaneous tissue: Secondary | ICD-10-CM | POA: Diagnosis present

## 2020-09-06 DIAGNOSIS — E1159 Type 2 diabetes mellitus with other circulatory complications: Secondary | ICD-10-CM | POA: Diagnosis present

## 2020-09-06 DIAGNOSIS — I152 Hypertension secondary to endocrine disorders: Secondary | ICD-10-CM | POA: Diagnosis present

## 2020-09-13 ENCOUNTER — Ambulatory Visit: Payer: Self-pay

## 2020-09-13 ENCOUNTER — Other Ambulatory Visit: Payer: Self-pay

## 2020-09-13 ENCOUNTER — Ambulatory Visit: Payer: Self-pay | Admitting: Nurse Practitioner

## 2020-09-13 DIAGNOSIS — J069 Acute upper respiratory infection, unspecified: Secondary | ICD-10-CM

## 2020-09-13 DIAGNOSIS — Z20822 Contact with and (suspected) exposure to covid-19: Secondary | ICD-10-CM

## 2020-09-13 DIAGNOSIS — R059 Cough, unspecified: Secondary | ICD-10-CM

## 2020-09-13 DIAGNOSIS — Z9049 Acquired absence of other specified parts of digestive tract: Secondary | ICD-10-CM | POA: Insufficient documentation

## 2020-09-13 LAB — POC COVID19 BINAXNOW: SARS Coronavirus 2 Ag: NEGATIVE

## 2020-09-13 MED ORDER — BENZONATATE 100 MG PO CAPS: 100.0000 mg | ORAL_CAPSULE | Freq: Three times a day (TID) | ORAL | 0 refills | Status: DC | PRN

## 2020-09-13 NOTE — Progress Notes (Signed)
   Subjective:    Patient ID: Gail Barber, female    DOB: Jun 12, 1961, 59 y.o.   MRN: [id number]  HPI 59 year old female here with complaints of a cough that then progressed into nasal congestion. She does have allergies, she is taking an OTC antihistamine. Today she is feeling better yesterday was her worst day.   Her cough has been dry and irritating. Denies a history of asthma, denies any tightness or wheezing.   She does frequently loose her sense of smell when her sinuses get backed up.   She has been vaccinated for COVID, and has had a flu vaccine.   She has had bronchitis in the past with need for antibiotics but not inhalers.  Review of Systems  Constitutional: Positive for fatigue.  HENT: Positive for congestion and sore throat.   Respiratory: Positive for cough.   Cardiovascular: Negative.   Gastrointestinal: Negative.   Neurological: Negative.    Current Outpatient Medications  Medication Instructions  . amLODipine (NORVASC) 10 MG tablet TAKE ONE TABLET BY MOUTH EVERY DAY.  Marland Kitchen aspirin EC 81 mg, Oral,  Once  . BD PEN NEEDLE NANO U/F 32G X 4 MM MISC USE 4 TIMES DAILY WITH LANTUS AND NOVOLOG PENS.  . benzonatate (TESSALON) 100 mg, Oral, 3 times daily PRN  . BIOTIN PO 1 tablet, Oral, Daily  . Cholecalciferol 1000 units TBDP 1 tablet, Oral, Daily  . insulin detemir (LEVEMIR) 100 UNIT/ML injection Subcutaneous, 2 times daily, 12units in the am and 10units at night  . JARDIANCE 25 MG TABS tablet No dose, route, or frequency recorded.  Marland Kitchen lisinopril (ZESTRIL) 20 mg, Oral,  Once  . metFORMIN (GLUCOPHAGE) 1,000 mg, Oral, 2 times daily  . Multiple Vitamins-Minerals (MULTIVITAMIN ADULT PO) 1 tablet, Oral, Daily  . Polyethyl Glycol-Propyl Glycol (SYSTANE OP) Ophthalmic  . sodium chloride (OCEAN) 0.65 % SOLN nasal spray 2 sprays, Each Nare, Every 2 hours while awake  . vitamin B-12 (CYANOCOBALAMIN) 1,000 mcg, Oral,  Once    No past medical history on file.    Objective:    Physical Exam  This was a telehealth appointment with patient after nurse visit for RAPID COVID testing.   Rapid test was negative.   Patient is in no acute distress over the phone, did not cough during conversation exhibited no signs of acute distress or SOB  Ref Range & Units 15:09  SARS Coronavirus 2 Ag Negative Negative   Comment: Vaccinated symptomatic patient aware of negative POC results. Has telephone appointment with Marigene Ehlers FNP for additional information.        Assessment & Plan:  Since patient is improving today, encouraged to continue allergy medication, add benzonatate for cough support, push fluids and rest.   Will excuse from work tonight. Patient to call clinic tomorrow if symptoms persist or worsen and will consider adding antibiotic if symptoms worsen/ cough becomes more productive etc  Meds ordered this encounter  Medications  . benzonatate (TESSALON) 100 MG capsule    Sig: Take 1 capsule (100 mg total) by mouth 3 (three) times daily as needed for cough.    Dispense:  30 capsule    Refill:  0

## 2020-09-14 ENCOUNTER — Other Ambulatory Visit: Payer: Self-pay | Admitting: Physician Assistant

## 2020-09-14 ENCOUNTER — Ambulatory Visit: Payer: Self-pay | Admitting: Nurse Practitioner

## 2020-09-14 ENCOUNTER — Other Ambulatory Visit (HOSPITAL_COMMUNITY): Payer: Self-pay | Admitting: Physician Assistant

## 2020-09-14 DIAGNOSIS — D1803 Hemangioma of intra-abdominal structures: Secondary | ICD-10-CM

## 2020-09-15 ENCOUNTER — Telehealth: Payer: Self-pay | Admitting: Medical

## 2020-09-15 ENCOUNTER — Other Ambulatory Visit: Payer: Self-pay

## 2020-09-28 ENCOUNTER — Ambulatory Visit
Admission: RE | Admit: 2020-09-28 | Discharge: 2020-09-28 | Disposition: A | Discharge status: Home / Self Care | Payer: BC Managed Care – PPO | Source: Ambulatory Visit | Attending: Physician Assistant | Admitting: Physician Assistant

## 2020-09-28 ENCOUNTER — Other Ambulatory Visit: Payer: Self-pay

## 2020-09-28 DIAGNOSIS — D1803 Hemangioma of intra-abdominal structures: Secondary | ICD-10-CM | POA: Diagnosis not present

## 2020-09-28 MED ORDER — GADOBUTROL 1 MMOL/ML IV SOLN
6.0000 mL | Freq: Once | INTRAVENOUS | Status: AC | PRN
  Administered 2020-09-28: 6 mL via INTRAVENOUS

## 2020-10-07 ENCOUNTER — Other Ambulatory Visit: Payer: Self-pay | Admitting: Medical

## 2020-11-23 ENCOUNTER — Other Ambulatory Visit: Payer: BC Managed Care – PPO

## 2020-11-23 ENCOUNTER — Other Ambulatory Visit: Payer: Self-pay

## 2020-11-23 DIAGNOSIS — R809 Proteinuria, unspecified: Secondary | ICD-10-CM

## 2020-11-23 DIAGNOSIS — E781 Pure hyperglyceridemia: Secondary | ICD-10-CM

## 2020-11-23 DIAGNOSIS — E1159 Type 2 diabetes mellitus with other circulatory complications: Secondary | ICD-10-CM

## 2020-11-23 DIAGNOSIS — Z794 Long term (current) use of insulin: Secondary | ICD-10-CM

## 2020-11-23 DIAGNOSIS — I152 Hypertension secondary to endocrine disorders: Secondary | ICD-10-CM

## 2020-11-24 LAB — COMPREHENSIVE METABOLIC PANEL
ALT: 21 IU/L (ref 0–32)
AST: 22 IU/L (ref 0–40)
Albumin/Globulin Ratio: 1.8 (ref 1.2–2.2)
Albumin: 4.8 g/dL (ref 3.8–4.9)
Alkaline Phosphatase: 234 IU/L — ABNORMAL HIGH (ref 44–121)
BUN/Creatinine Ratio: 46 — ABNORMAL HIGH (ref 9–23)
BUN: 35 mg/dL — ABNORMAL HIGH (ref 6–24)
Bilirubin Total: 0.4 mg/dL (ref 0.0–1.2)
CO2: 21 mmol/L (ref 20–29)
Calcium: 10 mg/dL (ref 8.7–10.2)
Chloride: 108 mmol/L — ABNORMAL HIGH (ref 96–106)
Creatinine, Ser: 0.76 mg/dL (ref 0.57–1.00)
GFR calc Af Amer: 100 mL/min/{1.73_m2} (ref >59)
GFR calc non Af Amer: 87 mL/min/{1.73_m2} (ref >59)
Globulin, Total: 2.7 g/dL (ref 1.5–4.5)
Glucose: 143 mg/dL — ABNORMAL HIGH (ref 65–99)
Potassium: 4.8 mmol/L (ref 3.5–5.2)
Sodium: 144 mmol/L (ref 134–144)
Total Protein: 7.5 g/dL (ref 6.0–8.5)

## 2020-11-24 LAB — CBC WITH DIFFERENTIAL/PLATELET
Basophils Absolute: 0.1 10*3/uL (ref 0.0–0.2)
Basos: 1 %
EOS (ABSOLUTE): 0.1 10*3/uL (ref 0.0–0.4)
Eos: 2 %
Hematocrit: 39.8 % (ref 34.0–46.6)
Hemoglobin: 13.3 g/dL (ref 11.1–15.9)
Immature Grans (Abs): 0 10*3/uL (ref 0.0–0.1)
Immature Granulocytes: 0 %
Lymphocytes Absolute: 1.5 10*3/uL (ref 0.7–3.1)
Lymphs: 22 %
MCH: 24.5 pg — ABNORMAL LOW (ref 26.6–33.0)
MCHC: 33.4 g/dL (ref 31.5–35.7)
MCV: 73 fL — ABNORMAL LOW (ref 79–97)
Monocytes Absolute: 0.5 10*3/uL (ref 0.1–0.9)
Monocytes: 7 %
Neutrophils Absolute: 4.7 10*3/uL (ref 1.4–7.0)
Neutrophils: 68 %
Platelets: 318 10*3/uL (ref 150–450)
RBC: 5.43 x10E6/uL — ABNORMAL HIGH (ref 3.77–5.28)
RDW: 15.9 % — ABNORMAL HIGH (ref 11.7–15.4)
WBC: 6.9 10*3/uL (ref 3.4–10.8)

## 2020-11-24 LAB — HGB A1C W/O EAG: Hgb A1c MFr Bld: 10.4 % — ABNORMAL HIGH (ref 4.8–5.6)

## 2020-11-24 LAB — URINALYSIS, ROUTINE W REFLEX MICROSCOPIC
Bilirubin, UA: NEGATIVE
Ketones, UA: NEGATIVE
Leukocytes,UA: NEGATIVE
Nitrite, UA: NEGATIVE
RBC, UA: NEGATIVE
Specific Gravity, UA: 1.022 (ref 1.005–1.030)
Urobilinogen, Ur: 0.2 mg/dL (ref 0.2–1.0)
pH, UA: 5 (ref 5.0–7.5)

## 2020-11-24 LAB — LIPID PANEL
Chol/HDL Ratio: 3.9 ratio (ref 0.0–4.4)
Cholesterol, Total: 207 mg/dL — ABNORMAL HIGH (ref 100–199)
HDL: 53 mg/dL (ref >39)
LDL Chol Calc (NIH): 129 mg/dL — ABNORMAL HIGH (ref 0–99)
Triglycerides: 140 mg/dL (ref 0–149)
VLDL Cholesterol Cal: 25 mg/dL (ref 5–40)

## 2020-12-12 ENCOUNTER — Encounter: Payer: Self-pay | Admitting: *Deleted

## 2020-12-12 ENCOUNTER — Encounter: Payer: BC Managed Care – PPO | Attending: Physician Assistant | Admitting: *Deleted

## 2020-12-12 ENCOUNTER — Other Ambulatory Visit: Payer: Self-pay

## 2020-12-12 VITALS — BP 130/74 | Ht 62.0 in | Wt 145.6 lb

## 2020-12-12 DIAGNOSIS — E1129 Type 2 diabetes mellitus with other diabetic kidney complication: Secondary | ICD-10-CM | POA: Insufficient documentation

## 2020-12-12 DIAGNOSIS — Z794 Long term (current) use of insulin: Secondary | ICD-10-CM | POA: Diagnosis present

## 2020-12-12 DIAGNOSIS — R809 Proteinuria, unspecified: Secondary | ICD-10-CM | POA: Diagnosis not present

## 2020-12-12 DIAGNOSIS — E1169 Type 2 diabetes mellitus with other specified complication: Secondary | ICD-10-CM

## 2020-12-12 NOTE — Progress Notes (Signed)
Diabetes Self-Management Education  Visit Type: First/Initial  Appt. Start Time: 1055 Appt. End Time: 1205  12/12/2020  [name], identified by name and date of birth, is a 59 y.o. female with a diagnosis of Diabetes: Type 2.   ASSESSMENT  Blood pressure 130/74, height 5\' 2"  (1.575 m), weight 145 lb 9.6 oz (66 kg). Body mass index is 26.63 kg/m.   Diabetes Self-Management Education - 12/12/20 1528      Visit Information   Visit Type First/Initial      Initial Visit   Diabetes Type Type 2    Are you currently following a meal plan? No   "in the process"   Are you taking your medications as prescribed? Yes    Date Diagnosed "not sure"      Health Coping   How would you rate your overall health? Good      Psychosocial Assessment   Patient Belief/Attitude about Diabetes Other (comment)   "frustrating and emotional some days"   Self-care barriers None    Self-management support Doctor's office;Family    Patient Concerns Nutrition/Meal planning;Glycemic Control;Weight Control;Medication;Monitoring;Healthy Lifestyle    Special Needs None    Preferred Learning Style Auditory;Visual;Hands on    Learning Readiness Ready    How often do you need to have someone help you when you read instructions, pamphlets, or other written materials from your doctor or pharmacy? 1 - Never    What is the last grade level you completed in school? 12th      Pre-Education Assessment   Patient understands the diabetes disease and treatment process. Needs Review    Patient understands incorporating nutritional management into lifestyle. Needs Review    Patient undertands incorporating physical activity into lifestyle. Needs Instruction    Patient understands using medications safely. Needs Review    Patient understands monitoring blood glucose, interpreting and using results Needs Review    Patient understands prevention, detection, and treatment of acute complications. Needs Instruction     Patient understands prevention, detection, and treatment of chronic complications. Needs Review    Patient understands how to develop strategies to address psychosocial issues. Needs Review    Patient understands how to develop strategies to promote health/change behavior. Needs Review      Complications   Last HgB A1C per patient/outside source 10.4 %   11/23/2020   How often do you check your blood sugar? 3-4 times / week   Pt reports she doesn't like to stick her finger.   Fasting Blood glucose range (mg/dL) 130-179   She reports 1 FBG of 142 mg/dL today but also reports readings of 212 and 225 mg/dL if she snacks during the night.   Have you had a dilated eye exam in the past 12 months? Yes    Have you had a dental exam in the past 12 months? Yes    Are you checking your feet? Yes    How many days per week are you checking your feet? 7      Dietary Intake   Breakfast eggs and wheat toast; cheerios; oatmeal with raisins, nuts, little creamer and cinnamon    Snack (morning) pt reports 1-2 snacks/day - popcorn, nuts, fruit (pineapple, watermelon, peaches, apples, pears, oranges, grapefruit)    Lunch [place] sandwich; [employer] chicken and 1/2 biscuit; grapefruit    Dinner chicken, [place], fish, occasional beef or pork; peas, corn, brown rice, whole wheat or veggie pasta, green beans, broccoli, carrots, salads with lettuce, tomatoes, onions, peppers, nuts, sunflower  seeds, pinepple    Beverage(s) water, diet soda      Exercise   Exercise Type ADL's      Patient Education   Previous Diabetes Education Yes (please comment)   Pt has been to classes and had 1 visit with the dietitian in 2020   Disease state  Explored patient's options for treatment of their diabetes    Nutrition management  Role of diet in the treatment of diabetes and the relationship between the three main macronutrients and blood glucose level;Food label reading, portion sizes and measuring food.;Carbohydrate  counting;Reviewed blood glucose goals for pre and post meals and how to evaluate the patients' food intake on their blood glucose level.    Physical activity and exercise  Role of exercise on diabetes management, blood pressure control and cardiac health.    Medications Taught/reviewed insulin injection, site rotation, insulin storage and needle disposal.;Reviewed patients medication for diabetes, action, purpose, timing of dose and side effects.   Pt doesn't want to give injection to her abdomen, She is using her outer abdomen and her buttocks. She is not holding pen in place for 5-10 seconds after injection.   Monitoring Purpose and frequency of SMBG.;Taught/discussed recording of test results and interpretation of SMBG.;Identified appropriate SMBG and/or A1C goals.;Other (comment)   Discussed use of CGM. She reports that she used the [employer] years ago but this wasn't covered with insurance. Instructed her to follow up again with insurance for Amorita or Dexcom coverage since she is on insulin.   Acute complications Taught treatment of hypoglycemia - the 15 rule.    Chronic complications Relationship between chronic complications and blood glucose control;Retinopathy and reason for yearly dilated eye exams    Psychosocial adjustment Role of stress on diabetes;Identified and addressed patients feelings and concerns about diabetes   Pt is taking care of her sister that is bedbound.     Individualized Goals (developed by patient)   Reducing Risk Other (comment)   improve blood sugars, decrease medications, prevent diabetes complications, lose weight (just a little), lead a healthier lifestyle, become more fit     Outcomes   Expected Outcomes Demonstrated interest in learning. Expect positive outcomes           Individualized Plan for Diabetes Self-Management Training:   Learning Objective:  Patient will have a greater understanding of diabetes self-management. Patient education plan is to  attend individual and/or group sessions per assessed needs and concerns.   Plan:   Patient Instructions  Check blood sugars 2 x day before breakfast and 2 hrs after one meal 3 x week Bring blood sugar records to the next appointment Check insurance coverage for Continuous Glucose Monitors  Exercise: Begin walking  for  10-15  minutes 3  days a week and gradually increase to 30 minutes 5 x weeek  Eat 3 meals day, 1-2 snacks a day Space meals 4-6 hours apart Pay attention to portion sizes Have more consistent meal times  Complete 3 Day Food Record and bring to next appt  Carry fast acting glucose and a snack at all times Rotate injection sites Hold insulin pen in place for 5-10 seconds after injection   Return for appointment on:  Monday Feb 21 at 9:00 am with Freda Munro (nurse)   Expected Outcomes:  Demonstrated interest in learning. Expect positive outcomes  Education material provided:  General Meal Planning Guidelines Simple Meal Plan 3 Day Food Record Symptoms, causes and treatments of Hypoglycemia Site Selection BD  If problems  or questions, patient to contact team via:  Johny Drilling, RN, [place], [place] [phone]  Future DSME appointment: January 01, 2021 with this nurse. She has been to classes in the past and also saw a dietitian.

## 2020-12-12 NOTE — Patient Instructions (Addendum)
Check blood sugars 2 x day before breakfast and 2 hrs after one meal 3 x week Bring blood sugar records to the next appointment Check insurance coverage for Continuous Glucose Monitors  Exercise: Begin walking  for  10-15  minutes 3  days a week and gradually increase to 30 minutes 5 x weeek  Eat 3 meals day, 1-2 snacks a day Space meals 4-6 hours apart Pay attention to portion sizes Have more consistent meal times  Complete 3 Day Food Record and bring to next appt  Carry fast acting glucose and a snack at all times Rotate injection sites Hold insulin pen in place for 5-10 seconds after injection   Return for appointment on:  Monday Feb 21 at 9:00 am with Freda Munro (nurse)

## 2020-12-20 ENCOUNTER — Other Ambulatory Visit: Payer: Self-pay

## 2020-12-20 ENCOUNTER — Emergency Department
Admission: EM | Admit: 2020-12-20 | Discharge: 2020-12-20 | Disposition: A | Discharge status: Home / Self Care | Payer: BC Managed Care – PPO | Attending: Emergency Medicine | Admitting: Emergency Medicine

## 2020-12-20 ENCOUNTER — Emergency Department: Payer: BC Managed Care – PPO

## 2020-12-20 DIAGNOSIS — S20219A Contusion of unspecified front wall of thorax, initial encounter: Secondary | ICD-10-CM

## 2020-12-20 DIAGNOSIS — M25562 Pain in left knee: Secondary | ICD-10-CM | POA: Diagnosis not present

## 2020-12-20 DIAGNOSIS — Z7984 Long term (current) use of oral hypoglycemic drugs: Secondary | ICD-10-CM | POA: Diagnosis not present

## 2020-12-20 DIAGNOSIS — Z7982 Long term (current) use of aspirin: Secondary | ICD-10-CM | POA: Insufficient documentation

## 2020-12-20 DIAGNOSIS — E113391 Type 2 diabetes mellitus with moderate nonproliferative diabetic retinopathy without macular edema, right eye: Secondary | ICD-10-CM | POA: Diagnosis not present

## 2020-12-20 DIAGNOSIS — Z79899 Other long term (current) drug therapy: Secondary | ICD-10-CM | POA: Diagnosis not present

## 2020-12-20 DIAGNOSIS — M25561 Pain in right knee: Secondary | ICD-10-CM | POA: Insufficient documentation

## 2020-12-20 DIAGNOSIS — E1165 Type 2 diabetes mellitus with hyperglycemia: Secondary | ICD-10-CM | POA: Insufficient documentation

## 2020-12-20 DIAGNOSIS — S29001A Unspecified injury of muscle and tendon of front wall of thorax, initial encounter: Secondary | ICD-10-CM | POA: Diagnosis present

## 2020-12-20 DIAGNOSIS — Z794 Long term (current) use of insulin: Secondary | ICD-10-CM | POA: Diagnosis not present

## 2020-12-20 DIAGNOSIS — I10 Essential (primary) hypertension: Secondary | ICD-10-CM | POA: Insufficient documentation

## 2020-12-20 DIAGNOSIS — Y92410 Unspecified street and highway as the place of occurrence of the external cause: Secondary | ICD-10-CM | POA: Insufficient documentation

## 2020-12-20 DIAGNOSIS — S2232XA Fracture of one rib, left side, initial encounter for closed fracture: Secondary | ICD-10-CM | POA: Diagnosis not present

## 2020-12-20 MED ORDER — CYCLOBENZAPRINE HCL 5 MG PO TABS: 5.0000 mg | ORAL_TABLET | Freq: Three times a day (TID) | ORAL | 0 refills | Status: DC | PRN

## 2020-12-20 MED ORDER — NABUMETONE 750 MG PO TABS: 750.0000 mg | ORAL_TABLET | Freq: Two times a day (BID) | ORAL | 0 refills | Status: AC

## 2020-12-20 MED ORDER — OXYCODONE HCL 5 MG PO TABS
5.0000 mg | ORAL_TABLET | Freq: Once | ORAL | Status: AC
  Administered 2020-12-20: 5 mg via ORAL
  Filled 2020-12-20: qty 1

## 2020-12-20 MED ORDER — TRAMADOL HCL 50 MG PO TABS: 50.0000 mg | ORAL_TABLET | Freq: Three times a day (TID) | ORAL | 0 refills | Status: AC | PRN

## 2020-12-20 MED ORDER — LIDOCAINE 5 % EX PTCH: 1.0000 | MEDICATED_PATCH | Freq: Two times a day (BID) | CUTANEOUS | 0 refills | Status: AC | PRN

## 2020-12-20 MED ORDER — BACITRACIN-NEOMYCIN-POLYMYXIN 400-5-5000 EX OINT
TOPICAL_OINTMENT | Freq: Once | CUTANEOUS | Status: AC
  Administered 2020-12-20: 2 via TOPICAL
  Filled 2020-12-20: qty 2
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
## 2020-12-20 NOTE — ED Notes (Signed)
Discharge instructions reviewed with pt and husband. Pt calm , collective .

## 2020-12-20 NOTE — Discharge Instructions (Signed)
Take the meds as directed as directed. Follow-up with your provider. Keep the wounds clean, dry, and covered. Return if needed.

## 2021-01-01 ENCOUNTER — Encounter: Payer: BC Managed Care – PPO | Admitting: *Deleted

## 2021-01-01 ENCOUNTER — Other Ambulatory Visit: Payer: Self-pay

## 2021-01-01 ENCOUNTER — Encounter: Payer: Self-pay | Admitting: *Deleted

## 2021-01-01 VITALS — BP 134/68 | Wt 142.0 lb

## 2021-01-01 DIAGNOSIS — E1129 Type 2 diabetes mellitus with other diabetic kidney complication: Secondary | ICD-10-CM | POA: Diagnosis not present

## 2021-01-01 DIAGNOSIS — Z794 Long term (current) use of insulin: Secondary | ICD-10-CM

## 2021-01-01 DIAGNOSIS — E1169 Type 2 diabetes mellitus with other specified complication: Secondary | ICD-10-CM

## 2021-01-01 NOTE — Patient Instructions (Signed)
Check blood sugars fasting and 2 hrs after one meal at least 3 x week Bring blood sugar records or glucometer to the next MD appointment Check insurance coverage for CGM  Exercise: Walk as tolerated  Eat 3 meals day, 1-2  snacks a day Space meals 4-6 hours apart Continue eating less portions  Carry fast acting glucose and a snack at all times Rotate injection sites  Call back for any quetsions
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
## 2021-04-19 ENCOUNTER — Other Ambulatory Visit: Payer: Self-pay

## 2021-04-19 ENCOUNTER — Other Ambulatory Visit: Payer: BC Managed Care – PPO

## 2021-04-19 DIAGNOSIS — E1129 Type 2 diabetes mellitus with other diabetic kidney complication: Secondary | ICD-10-CM

## 2021-04-19 DIAGNOSIS — Z794 Long term (current) use of insulin: Secondary | ICD-10-CM

## 2021-04-19 DIAGNOSIS — E781 Pure hyperglyceridemia: Secondary | ICD-10-CM

## 2021-04-19 DIAGNOSIS — R809 Proteinuria, unspecified: Secondary | ICD-10-CM

## 2021-04-20 LAB — COMPREHENSIVE METABOLIC PANEL
ALT: 20 IU/L (ref 0–32)
AST: 19 IU/L (ref 0–40)
Albumin/Globulin Ratio: 1.4 (ref 1.2–2.2)
Albumin: 4.4 g/dL (ref 3.8–4.9)
Alkaline Phosphatase: 257 IU/L — ABNORMAL HIGH (ref 44–121)
BUN/Creatinine Ratio: 36 — ABNORMAL HIGH (ref 9–23)
BUN: 32 mg/dL — ABNORMAL HIGH (ref 6–24)
Bilirubin Total: 0.3 mg/dL (ref 0.0–1.2)
CO2: 23 mmol/L (ref 20–29)
Calcium: 9.9 mg/dL (ref 8.7–10.2)
Chloride: 103 mmol/L (ref 96–106)
Creatinine, Ser: 0.88 mg/dL (ref 0.57–1.00)
Globulin, Total: 3.2 g/dL (ref 1.5–4.5)
Glucose: 137 mg/dL — ABNORMAL HIGH (ref 65–99)
Potassium: 4.3 mmol/L (ref 3.5–5.2)
Sodium: 143 mmol/L (ref 134–144)
Total Protein: 7.6 g/dL (ref 6.0–8.5)
eGFR: 76 mL/min/{1.73_m2} (ref >59)

## 2021-04-20 LAB — HGB A1C W/O EAG: Hgb A1c MFr Bld: 9 % — ABNORMAL HIGH (ref 4.8–5.6)

## 2021-04-20 LAB — LIPID PANEL WITH LDL/HDL RATIO
Cholesterol, Total: 171 mg/dL (ref 100–199)
HDL: 54 mg/dL (ref >39)
LDL Chol Calc (NIH): 94 mg/dL (ref 0–99)
LDL/HDL Ratio: 1.7 ratio (ref 0.0–3.2)
Triglycerides: 131 mg/dL (ref 0–149)
VLDL Cholesterol Cal: 23 mg/dL (ref 5–40)

## 2021-04-20 LAB — MICROALBUMIN / CREATININE URINE RATIO
Creatinine, Urine: 56.1 mg/dL
Microalb/Creat Ratio: 89 mg/g creat — ABNORMAL HIGH (ref 0–29)
Microalbumin, Urine: 50.1 ug/mL

## 2021-08-07 ENCOUNTER — Other Ambulatory Visit: Payer: Self-pay

## 2021-08-07 ENCOUNTER — Other Ambulatory Visit: Payer: BC Managed Care – PPO

## 2021-08-07 DIAGNOSIS — E1129 Type 2 diabetes mellitus with other diabetic kidney complication: Secondary | ICD-10-CM

## 2021-08-08 LAB — BASIC METABOLIC PANEL
BUN/Creatinine Ratio: 31 — ABNORMAL HIGH (ref 9–23)
BUN: 24 mg/dL (ref 6–24)
CO2: 23 mmol/L (ref 20–29)
Calcium: 9.9 mg/dL (ref 8.7–10.2)
Chloride: 101 mmol/L (ref 96–106)
Creatinine, Ser: 0.78 mg/dL (ref 0.57–1.00)
Glucose: 159 mg/dL — ABNORMAL HIGH (ref 70–99)
Potassium: 4.2 mmol/L (ref 3.5–5.2)
Sodium: 141 mmol/L (ref 134–144)
eGFR: 87 mL/min/{1.73_m2} (ref >59)

## 2021-08-08 LAB — LIPID PANEL
Chol/HDL Ratio: 3.7 ratio (ref 0.0–4.4)
Cholesterol, Total: 182 mg/dL (ref 100–199)
HDL: 49 mg/dL (ref >39)
LDL Chol Calc (NIH): 99 mg/dL (ref 0–99)
Triglycerides: 197 mg/dL — ABNORMAL HIGH (ref 0–149)
VLDL Cholesterol Cal: 34 mg/dL (ref 5–40)

## 2021-08-08 LAB — MICROALBUMIN / CREATININE URINE RATIO
Creatinine, Urine: 49.2 mg/dL
Microalb/Creat Ratio: 153 mg/g creat — ABNORMAL HIGH (ref 0–29)
Microalbumin, Urine: 75.5 ug/mL

## 2021-08-08 LAB — HGB A1C W/O EAG: Hgb A1c MFr Bld: 9.3 % — ABNORMAL HIGH (ref 4.8–5.6)

## 2021-08-15 ENCOUNTER — Other Ambulatory Visit: Payer: Self-pay

## 2021-08-15 ENCOUNTER — Ambulatory Visit: Payer: BC Managed Care – PPO

## 2021-08-15 DIAGNOSIS — Z23 Encounter for immunization: Secondary | ICD-10-CM

## 2021-11-13 ENCOUNTER — Other Ambulatory Visit: Payer: BC Managed Care – PPO

## 2021-11-13 ENCOUNTER — Other Ambulatory Visit: Payer: Self-pay

## 2021-11-13 DIAGNOSIS — E1165 Type 2 diabetes mellitus with hyperglycemia: Secondary | ICD-10-CM

## 2021-11-14 LAB — BASIC METABOLIC PANEL
BUN/Creatinine Ratio: 40 — ABNORMAL HIGH (ref 9–23)
BUN: 34 mg/dL — ABNORMAL HIGH (ref 6–24)
CO2: 25 mmol/L (ref 20–29)
Calcium: 9.5 mg/dL (ref 8.7–10.2)
Chloride: 101 mmol/L (ref 96–106)
Creatinine, Ser: 0.85 mg/dL (ref 0.57–1.00)
Glucose: 115 mg/dL — ABNORMAL HIGH (ref 70–99)
Potassium: 4.2 mmol/L (ref 3.5–5.2)
Sodium: 141 mmol/L (ref 134–144)
eGFR: 79 mL/min/{1.73_m2} (ref >59)

## 2021-11-14 LAB — MICROALBUMIN / CREATININE URINE RATIO
Creatinine, Urine: 56.8 mg/dL
Microalb/Creat Ratio: 98 mg/g creat — ABNORMAL HIGH (ref 0–29)
Microalbumin, Urine: 55.7 ug/mL

## 2021-11-14 LAB — HGB A1C W/O EAG: Hgb A1c MFr Bld: 9.4 % — ABNORMAL HIGH (ref 4.8–5.6)

## 2021-11-20 ENCOUNTER — Other Ambulatory Visit: Payer: BC Managed Care – PPO
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
## 2022-02-27 ENCOUNTER — Other Ambulatory Visit: Payer: BC Managed Care – PPO

## 2022-02-27 DIAGNOSIS — E1100 Type 2 diabetes mellitus with hyperosmolarity without nonketotic hyperglycemic-hyperosmolar coma (NKHHC): Secondary | ICD-10-CM

## 2022-03-04 LAB — LIPID PANEL
Chol/HDL Ratio: 3.5 ratio (ref 0.0–4.4)
Cholesterol, Total: 192 mg/dL (ref 100–199)
HDL: 55 mg/dL (ref >39)
LDL Chol Calc (NIH): 109 mg/dL — ABNORMAL HIGH (ref 0–99)
Triglycerides: 159 mg/dL — ABNORMAL HIGH (ref 0–149)
VLDL Cholesterol Cal: 28 mg/dL (ref 5–40)

## 2022-03-04 LAB — COMPREHENSIVE METABOLIC PANEL
ALT: 19 IU/L (ref 0–32)
AST: 20 IU/L (ref 0–40)
Albumin/Globulin Ratio: 1.6 (ref 1.2–2.2)
Albumin: 4.7 g/dL (ref 3.8–4.9)
Alkaline Phosphatase: 255 IU/L — ABNORMAL HIGH (ref 44–121)
BUN/Creatinine Ratio: 29 — ABNORMAL HIGH (ref 9–23)
BUN: 24 mg/dL (ref 6–24)
Bilirubin Total: <0.2 mg/dL (ref 0.0–1.2)
CO2: 18 mmol/L — ABNORMAL LOW (ref 20–29)
Calcium: 9.5 mg/dL (ref 8.7–10.2)
Chloride: 109 mmol/L — ABNORMAL HIGH (ref 96–106)
Creatinine, Ser: 0.84 mg/dL (ref 0.57–1.00)
Globulin, Total: 3 g/dL (ref 1.5–4.5)
Glucose: 155 mg/dL — ABNORMAL HIGH (ref 70–99)
Potassium: 4.6 mmol/L (ref 3.5–5.2)
Sodium: 149 mmol/L — ABNORMAL HIGH (ref 134–144)
Total Protein: 7.7 g/dL (ref 6.0–8.5)
eGFR: 80 mL/min/{1.73_m2} (ref >59)

## 2022-03-04 LAB — HGB A1C W/O EAG: Hgb A1c MFr Bld: 7.9 % — ABNORMAL HIGH (ref 4.8–5.6)

## 2022-06-28 ENCOUNTER — Other Ambulatory Visit: Payer: Self-pay

## 2022-06-28 DIAGNOSIS — R809 Proteinuria, unspecified: Secondary | ICD-10-CM

## 2022-06-28 DIAGNOSIS — Z794 Long term (current) use of insulin: Secondary | ICD-10-CM

## 2022-06-28 DIAGNOSIS — E1159 Type 2 diabetes mellitus with other circulatory complications: Secondary | ICD-10-CM

## 2022-06-29 LAB — URINALYSIS, ROUTINE W REFLEX MICROSCOPIC
Bilirubin, UA: NEGATIVE
Ketones, UA: NEGATIVE
Leukocytes,UA: NEGATIVE
Nitrite, UA: NEGATIVE
Protein,UA: NEGATIVE
RBC, UA: NEGATIVE
Specific Gravity, UA: 1.019 (ref 1.005–1.030)
Urobilinogen, Ur: 0.2 mg/dL (ref 0.2–1.0)
pH, UA: 5.5 (ref 5.0–7.5)

## 2022-07-01 LAB — COMPREHENSIVE METABOLIC PANEL
ALT: 24 IU/L (ref 0–32)
AST: 20 IU/L (ref 0–40)
Albumin/Globulin Ratio: 1.6 (ref 1.2–2.2)
Albumin: 4.6 g/dL (ref 3.8–4.9)
Alkaline Phosphatase: 247 IU/L — ABNORMAL HIGH (ref 44–121)
BUN/Creatinine Ratio: 31 — ABNORMAL HIGH (ref 12–28)
BUN: 22 mg/dL (ref 8–27)
Bilirubin Total: 0.3 mg/dL (ref 0.0–1.2)
CO2: 24 mmol/L (ref 20–29)
Calcium: 10.1 mg/dL (ref 8.7–10.3)
Chloride: 103 mmol/L (ref 96–106)
Creatinine, Ser: 0.71 mg/dL (ref 0.57–1.00)
Globulin, Total: 2.8 g/dL (ref 1.5–4.5)
Glucose: 115 mg/dL — ABNORMAL HIGH (ref 70–99)
Potassium: 4.3 mmol/L (ref 3.5–5.2)
Sodium: 141 mmol/L (ref 134–144)
Total Protein: 7.4 g/dL (ref 6.0–8.5)
eGFR: 97 mL/min/{1.73_m2} (ref >59)

## 2022-07-01 LAB — CBC WITH DIFFERENTIAL/PLATELET
Basophils Absolute: 0.1 10*3/uL (ref 0.0–0.2)
Basos: 1 %
EOS (ABSOLUTE): 0.1 10*3/uL (ref 0.0–0.4)
Eos: 1 %
Hematocrit: 39.9 % (ref 34.0–46.6)
Hemoglobin: 13.2 g/dL (ref 11.1–15.9)
Immature Grans (Abs): 0 10*3/uL (ref 0.0–0.1)
Immature Granulocytes: 0 %
Lymphocytes Absolute: 1.5 10*3/uL (ref 0.7–3.1)
Lymphs: 21 %
MCH: 25.4 pg — ABNORMAL LOW (ref 26.6–33.0)
MCHC: 33.1 g/dL (ref 31.5–35.7)
MCV: 77 fL — ABNORMAL LOW (ref 79–97)
Monocytes Absolute: 0.5 10*3/uL (ref 0.1–0.9)
Monocytes: 7 %
Neutrophils Absolute: 5.2 10*3/uL (ref 1.4–7.0)
Neutrophils: 70 %
Platelets: 276 10*3/uL (ref 150–450)
RBC: 5.19 x10E6/uL (ref 3.77–5.28)
RDW: 14.5 % (ref 11.7–15.4)
WBC: 7.3 10*3/uL (ref 3.4–10.8)

## 2022-07-01 LAB — LIPID PANEL
Chol/HDL Ratio: 3.1 ratio (ref 0.0–4.4)
Cholesterol, Total: 169 mg/dL (ref 100–199)
HDL: 54 mg/dL (ref >39)
LDL Chol Calc (NIH): 89 mg/dL (ref 0–99)
Triglycerides: 148 mg/dL (ref 0–149)
VLDL Cholesterol Cal: 26 mg/dL (ref 5–40)

## 2022-07-01 LAB — HGB A1C W/O EAG: Hgb A1c MFr Bld: 8.4 % — ABNORMAL HIGH (ref 4.8–5.6)

## 2022-08-08 ENCOUNTER — Encounter: Payer: Self-pay | Admitting: Physician Assistant

## 2022-08-08 ENCOUNTER — Ambulatory Visit (INDEPENDENT_AMBULATORY_CARE_PROVIDER_SITE_OTHER): Payer: Self-pay | Admitting: Physician Assistant

## 2022-08-08 VITALS — BP 122/70 | HR 72 | Temp 98.1°F | Ht 62.0 in | Wt 139.8 lb

## 2022-08-08 DIAGNOSIS — R0982 Postnasal drip: Secondary | ICD-10-CM

## 2022-08-08 DIAGNOSIS — J9801 Acute bronchospasm: Secondary | ICD-10-CM

## 2022-08-08 LAB — POC COVID19 BINAXNOW: SARS Coronavirus 2 Ag: NEGATIVE

## 2022-08-08 MED ORDER — BENZONATATE 200 MG PO CAPS: 200.0000 mg | ORAL_CAPSULE | Freq: Three times a day (TID) | ORAL | 0 refills | Status: DC | PRN

## 2022-08-08 NOTE — Progress Notes (Signed)
[facility] [address], [place] [address]   Office Visit Note  Patient Name: Gail Barber Date of Birth [id number]  Medical Record number [id number]  Date of Service: 08/08/2022  Chief Complaint  Patient presents with   Cough    Coughing that started Sat. Nose has been running.      61 y/o F presents to the clinic for c/o cough and sore throat x 5 days. Pt initially started with Sore throat and took otc Coricidin sore throat for her throat pain and helped. Since then dry cough has lingered. Cough is worse at night. Some nasal congestion as well. No fever, CP, SOB, body aches. No known exposure to covid. NO h /o asthma.   Cough Associated symptoms include postnasal drip and a sore throat. Pertinent negatives include no shortness of breath or wheezing.      Current Medication:  Outpatient Encounter Medications as of 08/08/2022  Medication Sig   amLODipine (NORVASC) 10 MG tablet TAKE ONE TABLET BY MOUTH EVERY DAY.   aspirin EC 81 MG tablet Take 81 mg by mouth once.   BD PEN NEEDLE NANO U/F 32G X 4 MM MISC USE 4 TIMES DAILY WITH LANTUS AND NOVOLOG PENS.   benzonatate (TESSALON) 200 MG capsule Take 1 capsule (200 mg total) by mouth 3 (three) times daily as needed for cough.   BIOTIN PO Take 1 tablet by mouth daily.   Cholecalciferol 1000 units TBDP Take 1 tablet by mouth daily.   insulin detemir (LEVEMIR) 100 UNIT/ML injection Inject into the skin 2 (two) times daily. 12units in the am and 10units at night   JARDIANCE 25 MG TABS tablet Take 25 mg by mouth daily.   lisinopril (PRINIVIL,ZESTRIL) 20 MG tablet Take 20 mg by mouth once.   metFORMIN (GLUCOPHAGE) 1000 MG tablet Take 1,000 mg by mouth 2 (two) times daily.   Multiple Vitamins-Minerals (MULTIVITAMIN ADULT PO) Take 1 tablet by mouth daily.   Polyethyl Glycol-Propyl Glycol (SYSTANE OP) Apply to eye.   vitamin B-12 (CYANOCOBALAMIN) 1000 MCG tablet Take 1,000 mcg by mouth once.   albuterol (VENTOLIN HFA) 108  (90 Base) MCG/ACT inhaler INHALE 2 PUFFS BY MOUTH EVERY 6 HOURS AS NEEDED FOR COUGH OR WHEEZING (Patient not taking: Reported on 08/08/2022)   clobetasol (TEMOVATE) 0.05 % external solution Apply topically. (Patient not taking: Reported on 08/08/2022)   cyclobenzaprine (FLEXERIL) 5 MG tablet Take 1 tablet (5 mg total) by mouth 3 (three) times daily as needed. (Patient not taking: Reported on 01/01/2021)   oxyCODONE (OXY IR/ROXICODONE) 5 MG immediate release tablet Take by mouth. (Patient not taking: Reported on 01/01/2021)   pimecrolimus (ELIDEL) 1 % cream Apply topically 2 (two) times daily. (Patient not taking: Reported on 08/08/2022)   No facility-administered encounter medications on file as of 08/08/2022.      Medical History: Past Medical History:  Diagnosis Date   Diabetes mellitus without complication ([facility])    Diabetic retinopathy associated with type 2 diabetes mellitus ([facility])    Hypertension    Microalbuminuria      Vital Signs: BP 122/70 (BP Location: Left Arm, Patient Position: Sitting, Cuff Size: Normal)   Pulse 72   Temp 98.1 F (36.7 C) (Tympanic)   Ht '5\' 2"'$  (1.575 m)   Wt 139 lb 12.8 oz (63.4 kg)   SpO2 98%   BMI 25.57 kg/m    Review of Systems  Constitutional: Negative.   HENT:  Positive for congestion (nasal), postnasal drip and sore  throat. Negative for sinus pressure, sinus pain and trouble swallowing.   Respiratory:  Positive for cough. Negative for chest tightness, shortness of breath and wheezing.   Cardiovascular: Negative.   Neurological: Negative.     Physical Exam Constitutional:      Appearance: Normal appearance.  HENT:     Head: Atraumatic.     Right Ear: Tympanic membrane, ear canal and external ear normal.     Left Ear: Tympanic membrane, ear canal and external ear normal.     Nose: Nose normal.     Mouth/Throat:     Mouth: Mucous membranes are moist.     Pharynx: Oropharynx is clear.  Eyes:     Extraocular Movements: Extraocular movements  intact.  Cardiovascular:     Rate and Rhythm: Normal rate and regular rhythm.  Pulmonary:     Effort: Pulmonary effort is normal.     Breath sounds: Normal breath sounds.  Musculoskeletal:     Cervical back: Neck supple.  Skin:    General: Skin is warm.  Neurological:     Mental Status: She is alert.  Psychiatric:        Mood and Affect: Mood normal.        Behavior: Behavior normal.        Thought Content: Thought content normal.        Judgment: Judgment normal.       Assessment/Plan:   ICD-10-CM   1. Bronchospasm  J98.01 benzonatate (TESSALON) 200 MG capsule    2. Post-nasal drainage  R09.82 POC COVID-19 BinaxNow      Reviewed negative covid test with patient. She verbalized understanding. Increase fluids Take medicine as prescribed. Start otc oral anti-hitamine as directed on the box. Continue to watch for worsening symptoms. Consider Albuterol inhaler if cough doesn't improve.  RTC in 5-7 days or sooner if any difficulty arises.   General Counseling: Gail Barber verbalizes understanding of the findings of todays visit and agrees with plan of treatment. I have discussed any further diagnostic evaluation that may be needed or ordered today. We also reviewed her medications today. she has been encouraged to call the office with any questions or concerns that should arise related to todays visit.    Time spent:25 [place], [place] Physician Assistant

## 2022-08-22 ENCOUNTER — Other Ambulatory Visit: Payer: Self-pay

## 2022-08-22 DIAGNOSIS — E1159 Type 2 diabetes mellitus with other circulatory complications: Secondary | ICD-10-CM

## 2022-08-22 DIAGNOSIS — E781 Pure hyperglyceridemia: Secondary | ICD-10-CM

## 2022-08-22 DIAGNOSIS — Z794 Long term (current) use of insulin: Secondary | ICD-10-CM

## 2022-08-23 LAB — CBC WITH DIFFERENTIAL/PLATELET
Basophils Absolute: 0.1 10*3/uL (ref 0.0–0.2)
Basos: 1 %
EOS (ABSOLUTE): 0.1 10*3/uL (ref 0.0–0.4)
Eos: 1 %
Hematocrit: 39.7 % (ref 34.0–46.6)
Hemoglobin: 13.2 g/dL (ref 11.1–15.9)
Immature Grans (Abs): 0 10*3/uL (ref 0.0–0.1)
Immature Granulocytes: 0 %
Lymphocytes Absolute: 1.4 10*3/uL (ref 0.7–3.1)
Lymphs: 18 %
MCH: 24.6 pg — ABNORMAL LOW (ref 26.6–33.0)
MCHC: 33.2 g/dL (ref 31.5–35.7)
MCV: 74 fL — ABNORMAL LOW (ref 79–97)
Monocytes Absolute: 0.5 10*3/uL (ref 0.1–0.9)
Monocytes: 7 %
Neutrophils Absolute: 5.7 10*3/uL (ref 1.4–7.0)
Neutrophils: 73 %
Platelets: 301 10*3/uL (ref 150–450)
RBC: 5.37 x10E6/uL — ABNORMAL HIGH (ref 3.77–5.28)
RDW: 14.6 % (ref 11.7–15.4)
WBC: 7.9 10*3/uL (ref 3.4–10.8)

## 2022-08-23 LAB — COMPREHENSIVE METABOLIC PANEL
ALT: 28 IU/L (ref 0–32)
AST: 24 IU/L (ref 0–40)
Albumin/Globulin Ratio: 1.4 (ref 1.2–2.2)
Albumin: 4.4 g/dL (ref 3.8–4.9)
Alkaline Phosphatase: 322 IU/L — ABNORMAL HIGH (ref 44–121)
BUN/Creatinine Ratio: 31 — ABNORMAL HIGH (ref 12–28)
BUN: 26 mg/dL (ref 8–27)
Bilirubin Total: 0.3 mg/dL (ref 0.0–1.2)
CO2: 24 mmol/L (ref 20–29)
Calcium: 10.2 mg/dL (ref 8.7–10.3)
Chloride: 102 mmol/L (ref 96–106)
Creatinine, Ser: 0.83 mg/dL (ref 0.57–1.00)
Globulin, Total: 3.2 g/dL (ref 1.5–4.5)
Glucose: 139 mg/dL — ABNORMAL HIGH (ref 70–99)
Potassium: 4.4 mmol/L (ref 3.5–5.2)
Sodium: 144 mmol/L (ref 134–144)
Total Protein: 7.6 g/dL (ref 6.0–8.5)
eGFR: 81 mL/min/{1.73_m2} (ref >59)

## 2022-08-23 LAB — URINALYSIS, ROUTINE W REFLEX MICROSCOPIC
Bilirubin, UA: NEGATIVE
Ketones, UA: NEGATIVE
Leukocytes,UA: NEGATIVE
Nitrite, UA: NEGATIVE
RBC, UA: NEGATIVE
Specific Gravity, UA: 1.026 (ref 1.005–1.030)
Urobilinogen, Ur: 0.2 mg/dL (ref 0.2–1.0)
pH, UA: 5.5 (ref 5.0–7.5)

## 2022-08-23 LAB — LIPID PANEL
Chol/HDL Ratio: 3.3 ratio (ref 0.0–4.4)
Cholesterol, Total: 177 mg/dL (ref 100–199)
HDL: 54 mg/dL (ref >39)
LDL Chol Calc (NIH): 91 mg/dL (ref 0–99)
Triglycerides: 190 mg/dL — ABNORMAL HIGH (ref 0–149)
VLDL Cholesterol Cal: 32 mg/dL (ref 5–40)

## 2022-08-23 LAB — HGB A1C W/O EAG: Hgb A1c MFr Bld: 9.2 % — ABNORMAL HIGH (ref 4.8–5.6)

## 2022-10-31 ENCOUNTER — Encounter: Payer: Self-pay | Admitting: Physician Assistant

## 2022-10-31 ENCOUNTER — Ambulatory Visit (INDEPENDENT_AMBULATORY_CARE_PROVIDER_SITE_OTHER): Payer: Self-pay | Admitting: Physician Assistant

## 2022-10-31 VITALS — BP 133/78 | HR 69 | Temp 97.2°F | Wt 141.0 lb

## 2022-10-31 DIAGNOSIS — Z20822 Contact with and (suspected) exposure to covid-19: Secondary | ICD-10-CM

## 2022-10-31 LAB — POC COVID19 BINAXNOW: SARS Coronavirus 2 Ag: NEGATIVE

## 2022-10-31 NOTE — Progress Notes (Signed)
[facility] [address], [place] [address]   Office Visit Note  Patient Name: Gail Barber Date of Birth [id number]  Medical Record number [id number]  Date of Service: 10/31/2022  Chief Complaint  Patient presents with   Covid Exposure    Was exposed to COVID last night. No symptoms     61 y/o F presents to the clinic for a covid test for recent exposure. Pt denies any current symptoms.       Current Medication:  Outpatient Encounter Medications as of 10/31/2022  Medication Sig   amLODipine (NORVASC) 10 MG tablet TAKE ONE TABLET BY MOUTH EVERY DAY.   aspirin EC 81 MG tablet Take 81 mg by mouth once.   BD PEN NEEDLE NANO U/F 32G X 4 MM MISC USE 4 TIMES DAILY WITH LANTUS AND NOVOLOG PENS.   BIOTIN PO Take 1 tablet by mouth daily.   Cholecalciferol 1000 units TBDP Take 1 tablet by mouth daily.   insulin detemir (LEVEMIR) 100 UNIT/ML injection Inject into the skin 2 (two) times daily. 12units in the am and 10units at night   JARDIANCE 25 MG TABS tablet Take 25 mg by mouth daily.   lisinopril (PRINIVIL,ZESTRIL) 20 MG tablet Take 20 mg by mouth once.   metFORMIN (GLUCOPHAGE) 1000 MG tablet Take 1,000 mg by mouth 2 (two) times daily.   Multiple Vitamins-Minerals (MULTIVITAMIN ADULT PO) Take 1 tablet by mouth daily.   vitamin B-12 (CYANOCOBALAMIN) 1000 MCG tablet Take 1,000 mcg by mouth once.   albuterol (VENTOLIN HFA) 108 (90 Base) MCG/ACT inhaler INHALE 2 PUFFS BY MOUTH EVERY 6 HOURS AS NEEDED FOR COUGH OR WHEEZING (Patient not taking: Reported on 08/08/2022)   benzonatate (TESSALON) 200 MG capsule Take 1 capsule (200 mg total) by mouth 3 (three) times daily as needed for cough. (Patient not taking: Reported on 10/31/2022)   clobetasol (TEMOVATE) 0.05 % external solution Apply topically. (Patient not taking: Reported on 08/08/2022)   cyclobenzaprine (FLEXERIL) 5 MG tablet Take 1 tablet (5 mg total) by mouth 3 (three) times daily as needed. (Patient not taking:  Reported on 01/01/2021)   oxyCODONE (OXY IR/ROXICODONE) 5 MG immediate release tablet Take by mouth. (Patient not taking: Reported on 01/01/2021)   pimecrolimus (ELIDEL) 1 % cream Apply topically 2 (two) times daily. (Patient not taking: Reported on 08/08/2022)   Polyethyl Glycol-Propyl Glycol (SYSTANE OP) Apply to eye. (Patient not taking: Reported on 10/31/2022)   No facility-administered encounter medications on file as of 10/31/2022.      Medical History: Past Medical History:  Diagnosis Date   Diabetes mellitus without complication ([facility])    Diabetic retinopathy associated with type 2 diabetes mellitus ([facility])    Hypertension    Microalbuminuria      Vital Signs: BP 133/78 (BP Location: Left Arm, Patient Position: Sitting, Cuff Size: Normal)   Pulse 69   Temp (!) 97.2 F (36.2 C) (Tympanic)   Wt 141 lb (64 kg)   SpO2 98%   BMI 25.79 kg/m    Review of Systems  Constitutional: Negative.   HENT: Negative.    Respiratory: Negative.    Cardiovascular: Negative.   Neurological: Negative.     Physical Exam Constitutional:      Appearance: Normal appearance.  HENT:     Head: Atraumatic.     Right Ear: Tympanic membrane, ear canal and external ear normal.     Left Ear: Tympanic membrane, ear canal and external ear normal.     Nose:  Nose normal.     Mouth/Throat:     Mouth: Mucous membranes are moist.     Pharynx: Oropharynx is clear.  Eyes:     Extraocular Movements: Extraocular movements intact.  Cardiovascular:     Rate and Rhythm: Normal rate and regular rhythm.  Pulmonary:     Effort: Pulmonary effort is normal.     Breath sounds: Normal breath sounds.  Musculoskeletal:     Cervical back: Neck supple.  Skin:    General: Skin is warm.  Neurological:     Mental Status: She is alert.  Psychiatric:        Mood and Affect: Mood normal.        Behavior: Behavior normal.        Thought Content: Thought content normal.        Judgment: Judgment normal.        Assessment/Plan:  1. Exposure to COVID-19 virus - POC COVID-19 BinaxNow  Reviewed negative covid test with patient. She verbalized understanding.  Recommend to do home covid testing if symptoms occur including fever, congestion, sore throat, etc.  Pt was given information regarding free covid home kits via [facility].  Pt verbalized understanding and in agreement.   General Counseling: Shaquaya verbalizes understanding of the findings of todays visit and agrees with plan of treatment. I have discussed any further diagnostic evaluation that may be needed or ordered today. We also reviewed her medications today. she has been encouraged to call the office with any questions or concerns that should arise related to todays visit.    Time spent:20 [place], [place] Physician Assistant

## 2022-12-12 ENCOUNTER — Other Ambulatory Visit: Payer: Self-pay

## 2022-12-12 DIAGNOSIS — E1165 Type 2 diabetes mellitus with hyperglycemia: Secondary | ICD-10-CM

## 2022-12-13 LAB — COMPREHENSIVE METABOLIC PANEL
ALT: 24 IU/L (ref 0–32)
AST: 25 IU/L (ref 0–40)
Albumin/Globulin Ratio: 1.8 (ref 1.2–2.2)
Albumin: 4.8 g/dL (ref 3.8–4.9)
Alkaline Phosphatase: 264 IU/L — ABNORMAL HIGH (ref 44–121)
BUN/Creatinine Ratio: 37 — ABNORMAL HIGH (ref 12–28)
BUN: 25 mg/dL (ref 8–27)
Bilirubin Total: 0.3 mg/dL (ref 0.0–1.2)
CO2: 20 mmol/L (ref 20–29)
Calcium: 9.8 mg/dL (ref 8.7–10.3)
Chloride: 104 mmol/L (ref 96–106)
Creatinine, Ser: 0.68 mg/dL (ref 0.57–1.00)
Globulin, Total: 2.7 g/dL (ref 1.5–4.5)
Glucose: 143 mg/dL — ABNORMAL HIGH (ref 70–99)
Potassium: 4.4 mmol/L (ref 3.5–5.2)
Sodium: 141 mmol/L (ref 134–144)
Total Protein: 7.5 g/dL (ref 6.0–8.5)
eGFR: 100 mL/min/{1.73_m2} (ref >59)

## 2022-12-13 LAB — HGB A1C W/O EAG: Hgb A1c MFr Bld: 10.5 % — ABNORMAL HIGH (ref 4.8–5.6)

## 2023-03-11 ENCOUNTER — Other Ambulatory Visit: Payer: Self-pay

## 2023-03-11 DIAGNOSIS — E1165 Type 2 diabetes mellitus with hyperglycemia: Secondary | ICD-10-CM

## 2023-03-13 ENCOUNTER — Other Ambulatory Visit (INDEPENDENT_AMBULATORY_CARE_PROVIDER_SITE_OTHER): Payer: Self-pay

## 2023-03-13 DIAGNOSIS — E1165 Type 2 diabetes mellitus with hyperglycemia: Secondary | ICD-10-CM

## 2023-03-13 NOTE — Progress Notes (Signed)
Patients labs collected, and ready for [facility] pick up.

## 2023-03-14 LAB — COMPREHENSIVE METABOLIC PANEL

## 2023-03-14 LAB — LIPID PANEL W/O CHOL/HDL RATIO

## 2023-03-14 LAB — MICROALBUMIN / CREATININE URINE RATIO: Creatinine, Urine: 53.1 mg/dL

## 2023-03-14 LAB — HEMOGLOBIN A1C

## 2023-03-15 LAB — COMPREHENSIVE METABOLIC PANEL
Alkaline Phosphatase: 248 IU/L — ABNORMAL HIGH (ref 44–121)
BUN/Creatinine Ratio: 26 (ref 12–28)
BUN: 19 mg/dL (ref 8–27)
CO2: 22 mmol/L (ref 20–29)
Total Protein: 7.4 g/dL (ref 6.0–8.5)
eGFR: 94 mL/min/{1.73_m2} (ref >59)

## 2023-03-15 LAB — MICROALBUMIN / CREATININE URINE RATIO: Microalb/Creat Ratio: 288 mg/g creat — ABNORMAL HIGH (ref 0–29)

## 2023-03-15 LAB — SPECIMEN STATUS REPORT

## 2023-03-15 LAB — LIPID PANEL W/O CHOL/HDL RATIO: Cholesterol, Total: 188 mg/dL (ref 100–199)

## 2023-03-17 ENCOUNTER — Other Ambulatory Visit: Payer: Self-pay

## 2023-03-17 DIAGNOSIS — E1165 Type 2 diabetes mellitus with hyperglycemia: Secondary | ICD-10-CM

## 2023-03-17 LAB — COMPREHENSIVE METABOLIC PANEL
AST: 26 IU/L (ref 0–40)
Albumin: 4.6 g/dL (ref 3.8–4.9)
Bilirubin Total: <0.2 mg/dL (ref 0.0–1.2)
Calcium: 9.9 mg/dL (ref 8.7–10.3)
Chloride: 105 mmol/L (ref 96–106)
Globulin, Total: 2.8 g/dL (ref 1.5–4.5)
Potassium: 4 mmol/L (ref 3.5–5.2)
Sodium: 146 mmol/L — ABNORMAL HIGH (ref 134–144)

## 2023-03-17 LAB — LIPID PANEL W/O CHOL/HDL RATIO
HDL: 55 mg/dL (ref >39)
LDL Chol Calc (NIH): 105 mg/dL — ABNORMAL HIGH (ref 0–99)
VLDL Cholesterol Cal: 28 mg/dL (ref 5–40)

## 2023-03-17 LAB — MICROALBUMIN / CREATININE URINE RATIO: Microalbumin, Urine: 152.8 ug/mL

## 2023-07-07 ENCOUNTER — Ambulatory Visit: Payer: BC Managed Care – PPO

## 2023-07-07 DIAGNOSIS — Z1211 Encounter for screening for malignant neoplasm of colon: Secondary | ICD-10-CM | POA: Diagnosis present

## 2023-07-07 DIAGNOSIS — D125 Benign neoplasm of sigmoid colon: Secondary | ICD-10-CM | POA: Diagnosis not present

## 2023-07-07 DIAGNOSIS — K640 First degree hemorrhoids: Secondary | ICD-10-CM | POA: Diagnosis not present

## 2023-07-07 DIAGNOSIS — Z86010 Personal history of colonic polyps: Secondary | ICD-10-CM | POA: Diagnosis not present

## 2023-07-07 DIAGNOSIS — D123 Benign neoplasm of transverse colon: Secondary | ICD-10-CM | POA: Diagnosis not present

## 2023-07-07 DIAGNOSIS — K5730 Diverticulosis of large intestine without perforation or abscess without bleeding: Secondary | ICD-10-CM | POA: Diagnosis not present

## 2023-08-07 ENCOUNTER — Other Ambulatory Visit: Payer: Self-pay

## 2023-08-07 DIAGNOSIS — E1165 Type 2 diabetes mellitus with hyperglycemia: Secondary | ICD-10-CM

## 2023-08-08 LAB — HEMOGLOBIN A1C
Est. average glucose Bld gHb Est-mCnc: 200 mg/dL
Hgb A1c MFr Bld: 8.6 % — ABNORMAL HIGH (ref 4.8–5.6)

## 2023-08-11 ENCOUNTER — Ambulatory Visit: Payer: BC Managed Care – PPO | Admitting: Surgery

## 2023-08-13 ENCOUNTER — Ambulatory Visit (INDEPENDENT_AMBULATORY_CARE_PROVIDER_SITE_OTHER): Payer: Self-pay | Admitting: Adult Health

## 2023-08-13 ENCOUNTER — Other Ambulatory Visit: Payer: Self-pay

## 2023-08-13 ENCOUNTER — Encounter: Payer: Self-pay | Admitting: Adult Health

## 2023-08-13 VITALS — BP 106/54 | HR 68 | Temp 96.4°F | Ht 62.0 in | Wt 136.0 lb

## 2023-08-13 DIAGNOSIS — R051 Acute cough: Secondary | ICD-10-CM

## 2023-08-13 DIAGNOSIS — U071 COVID-19: Secondary | ICD-10-CM

## 2023-08-13 LAB — POC COVID19 BINAXNOW: SARS Coronavirus 2 Ag: POSITIVE — AB

## 2023-08-13 NOTE — Progress Notes (Signed)
[facility] [address] [place], [place] [address]   Office Visit Note  Patient Name: Gail Barber Date of Birth [id number]  Medical Record number [id number]  Date of Service: 08/13/2023  Chief Complaint  Patient presents with   Acute Visit    Patient c/o dry cough, nasal and chest congestion, and fatigue. Her throat was slightly sore initially but has since resolved after gargling peroxide with water. Symptoms began about 6 days ago. She did get flu/COVID vaccines two days after symptoms began and she states then her "symptoms began to flare up". She has not tried any OTC medications for relief of symptoms. Denies SOB and fever.      HPI Pt is here for a sick visit. She reports 6 days ago she started having sore throat, congestion, and coughing.  She has been taking zinc tablets.    Current Medication:  Outpatient Encounter Medications as of 08/13/2023  Medication Sig   amLODipine (NORVASC) 10 MG tablet TAKE ONE TABLET BY MOUTH EVERY DAY.   aspirin EC 81 MG tablet Take 81 mg by mouth once.   BD PEN NEEDLE NANO U/F 32G X 4 MM MISC USE 4 TIMES DAILY WITH LANTUS AND NOVOLOG PENS.   BIOTIN PO Take 1 tablet by mouth daily.   Cholecalciferol 1000 units TBDP Take 1 tablet by mouth daily.   JARDIANCE 25 MG TABS tablet Take 25 mg by mouth daily.   LANTUS SOLOSTAR 100 UNIT/ML Solostar Pen Inject 14 Units into the skin daily.   lisinopril (PRINIVIL,ZESTRIL) 20 MG tablet Take 20 mg by mouth daily.   metFORMIN (GLUCOPHAGE) 1000 MG tablet Take 1,000 mg by mouth 2 (two) times daily.   Multiple Vitamins-Minerals (MULTIVITAMIN ADULT PO) Take 1 tablet by mouth daily.   Polyethyl Glycol-Propyl Glycol (SYSTANE OP) Apply to eye as needed.   vitamin B-12 (CYANOCOBALAMIN) 1000 MCG tablet Take 1,000 mcg by mouth daily.   [DISCONTINUED] albuterol (VENTOLIN HFA) 108 (90 Base) MCG/ACT inhaler INHALE 2 PUFFS BY MOUTH EVERY 6 HOURS AS NEEDED FOR COUGH OR WHEEZING (Patient not taking: Reported on  08/08/2022)   [DISCONTINUED] benzonatate (TESSALON) 200 MG capsule Take 1 capsule (200 mg total) by mouth 3 (three) times daily as needed for cough. (Patient not taking: Reported on 10/31/2022)   [DISCONTINUED] clobetasol (TEMOVATE) 0.05 % external solution Apply topically. (Patient not taking: Reported on 08/08/2022)   [DISCONTINUED] cyclobenzaprine (FLEXERIL) 5 MG tablet Take 1 tablet (5 mg total) by mouth 3 (three) times daily as needed. (Patient not taking: Reported on 01/01/2021)   [DISCONTINUED] insulin detemir (LEVEMIR) 100 UNIT/ML injection Inject into the skin 2 (two) times daily. 12units in the am and 10units at night   [DISCONTINUED] oxyCODONE (OXY IR/ROXICODONE) 5 MG immediate release tablet Take by mouth. (Patient not taking: Reported on 01/01/2021)   [DISCONTINUED] pimecrolimus (ELIDEL) 1 % cream Apply topically 2 (two) times daily. (Patient not taking: Reported on 08/08/2022)   No facility-administered encounter medications on file as of 08/13/2023.      Medical History: Past Medical History:  Diagnosis Date   Diabetes mellitus without complication ([facility])    Diabetic retinopathy associated with type 2 diabetes mellitus ([facility])    Hypertension    Microalbuminuria      Vital Signs: BP (!) 106/54   Pulse 68   Temp (!) 96.4 F (35.8 C)   Ht 5\' 2"  (1.575 m)   Wt 136 lb (61.7 kg)   SpO2 97%   BMI 24.87 kg/m  Review of Systems  Constitutional:  Negative for chills, fatigue and fever.  HENT:  Positive for congestion and sore throat.   Eyes:  Negative for pain and itching.  Respiratory:  Positive for cough.   Cardiovascular:  Negative for chest pain.  Gastrointestinal:  Negative for diarrhea, nausea and vomiting.    Physical Exam Vitals reviewed.  Constitutional:      Appearance: Normal appearance.  HENT:     Head: Normocephalic.     Right Ear: Tympanic membrane and ear canal normal.     Left Ear: Tympanic membrane and ear canal normal.     Nose: Nose normal.      Mouth/Throat:     Mouth: Mucous membranes are moist.  Eyes:     Pupils: Pupils are equal, round, and reactive to light.  Pulmonary:     Effort: Pulmonary effort is normal.  Lymphadenopathy:     Cervical: No cervical adenopathy.  Neurological:     Mental Status: She is alert.    Results for orders placed or performed in visit on 08/13/23 (from the past 24 hour(s))  POC COVID-19     Status: Abnormal   Collection Time: 08/13/23 10:47 AM  Result Value Ref Range   SARS Coronavirus 2 Ag Positive (A) Negative    Assessment/Plan: 1. COVID Rest, and drink plenty of water.  Use cough drops, gargle warm sal water or drink wam liquids (like tea with honey) as needed for cough/throat irritation.   Take over-the-counter medicines (such as Dayquil or Nyquil) as discussed at your visit to help manage your symptoms.  Send a MyChart message to the provider or schedule a return appointment as needed for new/worsening symptoms (especially shortness of breath or chest pain) or if symptoms not improving with recommended treatment over the next 5-7 days.      2. Acute cough - POC COVID-19     General Counseling: Patrici verbalizes understanding of the findings of todays visit and agrees with plan of treatment. I have discussed any further diagnostic evaluation that may be needed or ordered today. We also reviewed her medications today. she has been encouraged to call the office with any questions or concerns that should arise related to todays visit.   Orders Placed This Encounter  Procedures   POC COVID-19    No orders of the defined types were placed in this encounter.   Time spent:15 Minutes    Johnna Acosta AGNP-C Nurse Practitioner

## 2023-11-25 ENCOUNTER — Other Ambulatory Visit: Payer: Self-pay

## 2023-11-25 DIAGNOSIS — E1165 Type 2 diabetes mellitus with hyperglycemia: Secondary | ICD-10-CM

## 2023-11-26 ENCOUNTER — Other Ambulatory Visit: Payer: Self-pay

## 2023-11-26 DIAGNOSIS — E1165 Type 2 diabetes mellitus with hyperglycemia: Secondary | ICD-10-CM

## 2023-11-28 LAB — COMPREHENSIVE METABOLIC PANEL
ALT: 36 [IU]/L — ABNORMAL HIGH (ref 0–32)
AST: 36 [IU]/L (ref 0–40)
Albumin: 4.6 g/dL (ref 3.9–4.9)
Alkaline Phosphatase: 280 [IU]/L — ABNORMAL HIGH (ref 44–121)
BUN/Creatinine Ratio: 35 — ABNORMAL HIGH (ref 12–28)
BUN: 26 mg/dL (ref 8–27)
Bilirubin Total: 0.4 mg/dL (ref 0.0–1.2)
CO2: 21 mmol/L (ref 20–29)
Calcium: 9.9 mg/dL (ref 8.7–10.3)
Chloride: 101 mmol/L (ref 96–106)
Creatinine, Ser: 0.75 mg/dL (ref 0.57–1.00)
Globulin, Total: 2.7 g/dL (ref 1.5–4.5)
Glucose: 181 mg/dL — ABNORMAL HIGH (ref 70–99)
Potassium: 4.1 mmol/L (ref 3.5–5.2)
Sodium: 139 mmol/L (ref 134–144)
Total Protein: 7.3 g/dL (ref 6.0–8.5)
eGFR: 91 mL/min/{1.73_m2} (ref >59)

## 2023-11-28 LAB — LIPID PANEL
Chol/HDL Ratio: 3.6 {ratio} (ref 0.0–4.4)
Cholesterol, Total: 174 mg/dL (ref 100–199)
HDL: 49 mg/dL (ref >39)
LDL Chol Calc (NIH): 97 mg/dL (ref 0–99)
Triglycerides: 164 mg/dL — ABNORMAL HIGH (ref 0–149)
VLDL Cholesterol Cal: 28 mg/dL (ref 5–40)

## 2023-11-28 LAB — HEMOGLOBIN A1C
Est. average glucose Bld gHb Est-mCnc: 292 mg/dL
Hgb A1c MFr Bld: 11.8 % — ABNORMAL HIGH (ref 4.8–5.6)

## 2023-11-28 LAB — MICROALBUMIN / CREATININE URINE RATIO
Creatinine, Urine: 48.3 mg/dL
Microalb/Creat Ratio: 169 mg/g{creat} — ABNORMAL HIGH (ref 0–29)
Microalbumin, Urine: 81.6 ug/mL

## 2023-12-30 ENCOUNTER — Other Ambulatory Visit: Payer: Self-pay | Admitting: Physician Assistant

## 2023-12-30 DIAGNOSIS — N6341 Unspecified lump in right breast, subareolar: Secondary | ICD-10-CM

## 2024-01-05 ENCOUNTER — Inpatient Hospital Stay
Admission: RE | Admit: 2024-01-05 | Discharge: 2024-01-05 | Disposition: A | Discharge status: Home / Self Care | Payer: Self-pay | Source: Ambulatory Visit | Attending: Physician Assistant | Admitting: Physician Assistant

## 2024-01-05 ENCOUNTER — Other Ambulatory Visit: Payer: Self-pay | Admitting: *Deleted

## 2024-01-05 DIAGNOSIS — Z1231 Encounter for screening mammogram for malignant neoplasm of breast: Secondary | ICD-10-CM

## 2024-01-06 ENCOUNTER — Other Ambulatory Visit: Payer: BC Managed Care – PPO

## 2024-01-07 ENCOUNTER — Ambulatory Visit
Admission: RE | Admit: 2024-01-07 | Discharge: 2024-01-07 | Disposition: A | Discharge status: Home / Self Care | Payer: BC Managed Care – PPO | Source: Ambulatory Visit | Attending: Physician Assistant | Admitting: Physician Assistant

## 2024-01-07 ENCOUNTER — Other Ambulatory Visit: Payer: Self-pay | Admitting: Physician Assistant

## 2024-01-07 DIAGNOSIS — N6341 Unspecified lump in right breast, subareolar: Secondary | ICD-10-CM

## 2024-01-08 ENCOUNTER — Ambulatory Visit (INDEPENDENT_AMBULATORY_CARE_PROVIDER_SITE_OTHER): Payer: Self-pay | Admitting: Adult Health

## 2024-01-08 ENCOUNTER — Encounter: Payer: Self-pay | Admitting: Adult Health

## 2024-01-08 ENCOUNTER — Other Ambulatory Visit: Payer: Self-pay

## 2024-01-08 VITALS — BP 169/82 | HR 71 | Temp 98.5°F | Ht 62.0 in | Wt 135.0 lb

## 2024-01-08 DIAGNOSIS — J029 Acute pharyngitis, unspecified: Secondary | ICD-10-CM

## 2024-01-08 DIAGNOSIS — R051 Acute cough: Secondary | ICD-10-CM

## 2024-01-08 LAB — POC SOFIA 2 FLU + SARS ANTIGEN FIA
Influenza A, POC: NEGATIVE
Influenza B, POC: NEGATIVE
SARS Coronavirus 2 Ag: NEGATIVE

## 2024-01-08 MED ORDER — ALBUTEROL SULFATE HFA 108 (90 BASE) MCG/ACT IN AERS: INHALATION_SPRAY | RESPIRATORY_TRACT | 0 refills | Status: AC

## 2024-01-08 MED ORDER — AZITHROMYCIN 250 MG PO TABS: ORAL_TABLET | ORAL | 0 refills | Status: AC

## 2024-01-08 NOTE — Progress Notes (Signed)
 [facility] [address] [place], [place] [address]   Office Visit Note  Patient Name: Gail Barber Date of Birth [id number]  Medical Record number [id number]  Date of Service: 01/08/2024  Chief Complaint  Patient presents with   Sick visit    Patient c/o dry cough, scratchy throat, and runny nose x 2 days. She took Robitussin last night which was somewhat helpful. No known sick contacts.     HPI Pt is here for a sick visit. She reports 2 days of scratchy throat, cough, headache, chest tightness, faint wheeze, and runny nose. Throat is worse late in the evening, and early morning.  Denies ear pain, fever, chills, NVD Denies sick contacts.  She took robitussin with some improvement.    Current Medication:  Outpatient Encounter Medications as of 01/08/2024  Medication Sig   albuterol (VENTOLIN HFA) 108 (90 Base) MCG/ACT inhaler Inhale 2 puffs three times daily for one week.  Then use 2 puffs every 4-6 hours as needed for cough   amLODipine (NORVASC) 10 MG tablet TAKE ONE TABLET BY MOUTH EVERY DAY.   aspirin EC 81 MG tablet Take 81 mg by mouth once.   azithromycin (ZITHROMAX) 250 MG tablet Take 2 tablets on day 1, then 1 tablet daily on days 2 through 5   BD PEN NEEDLE NANO U/F 32G X 4 MM MISC USE 4 TIMES DAILY WITH LANTUS AND NOVOLOG PENS.   BIOTIN PO Take 1 tablet by mouth daily.   Cholecalciferol 1000 units TBDP Take 1 tablet by mouth daily.   JARDIANCE 25 MG TABS tablet Take 25 mg by mouth daily.   metFORMIN (GLUCOPHAGE) 1000 MG tablet Take 1,000 mg by mouth 2 (two) times daily.   Multiple Vitamins-Minerals (MULTIVITAMIN ADULT PO) Take 1 tablet by mouth daily.   Polyethyl Glycol-Propyl Glycol (SYSTANE OP) Apply to eye as needed.   SEMGLEE, YFGN, 100 UNIT/ML Pen Inject 14 Units into the skin daily.   vitamin B-12 (CYANOCOBALAMIN) 1000 MCG tablet Take 1,000 mcg by mouth daily.   lisinopril (PRINIVIL,ZESTRIL) 20 MG tablet Take 20 mg by mouth daily. (Patient not taking:  Reported on 01/08/2024)   [DISCONTINUED] LANTUS SOLOSTAR 100 UNIT/ML Solostar Pen Inject 14 Units into the skin daily.   No facility-administered encounter medications on file as of 01/08/2024.      Medical History: Past Medical History:  Diagnosis Date   Diabetes mellitus without complication (HCC)    Diabetic retinopathy associated with type 2 diabetes mellitus (HCC)    Hypertension    Microalbuminuria      Vital Signs: BP (!) 169/82 Comment: out of lisinopril  Pulse 71   Temp 98.5 F (36.9 C)   Ht 5\' 2"  (1.575 m)   Wt 135 lb (61.2 kg)   SpO2 99%   BMI 24.69 kg/m    Review of Systems  Constitutional:  Negative for chills, fatigue and fever.  HENT:  Negative for congestion.   Eyes:  Negative for pain and redness.  Respiratory:  Positive for cough and wheezing.     Physical Exam Vitals reviewed.  Constitutional:      Appearance: Normal appearance.  HENT:     Head: Normocephalic.     Right Ear: Tympanic membrane and ear canal normal.     Left Ear: Tympanic membrane and ear canal normal.     Nose: Nose normal.     Mouth/Throat:     Mouth: Mucous membranes are moist.  Eyes:     Pupils: Pupils  are equal, round, and reactive to light.  Pulmonary:     Effort: Pulmonary effort is normal.     Breath sounds: Normal breath sounds.  Lymphadenopathy:     Cervical: No cervical adenopathy.  Neurological:     Mental Status: She is alert.    Results for orders placed or performed in visit on 01/08/24 (from the past 24 hours)  POC SOFIA 2 FLU + SARS ANTIGEN FIA     Status: Normal   Collection Time: 01/08/24  2:28 PM  Result Value Ref Range   Influenza A, POC Negative Negative   Influenza B, POC Negative Negative   SARS Coronavirus 2 Ag Negative Negative    Assessment/Plan: 1. Acute cough (Primary) Take zpak and use albuterol as discussed. Follow up via MyChart messenger if symptoms fail to improve or may return to clinic as needed for worsening symptoms.   - POC  SOFIA 2 FLU + SARS ANTIGEN FIA - azithromycin (ZITHROMAX) 250 MG tablet; Take 2 tablets on day 1, then 1 tablet daily on days 2 through 5  Dispense: 6 tablet; Refill: 0 - albuterol (VENTOLIN HFA) 108 (90 Base) MCG/ACT inhaler; Inhale 2 puffs three times daily for one week.  Then use 2 puffs every 4-6 hours as needed for cough  Dispense: 8.5 g; Refill: 0  2. Sore throat - POC SOFIA 2 FLU + SARS ANTIGEN FIA     General Counseling: Stephene verbalizes understanding of the findings of todays visit and agrees with plan of treatment. I have discussed any further diagnostic evaluation that may be needed or ordered today. We also reviewed her medications today. she has been encouraged to call the office with any questions or concerns that should arise related to todays visit.   Orders Placed This Encounter  Procedures   POC SOFIA 2 FLU + SARS ANTIGEN FIA    Meds ordered this encounter  Medications   azithromycin (ZITHROMAX) 250 MG tablet    Sig: Take 2 tablets on day 1, then 1 tablet daily on days 2 through 5    Dispense:  6 tablet    Refill:  0   albuterol (VENTOLIN HFA) 108 (90 Base) MCG/ACT inhaler    Sig: Inhale 2 puffs three times daily for one week.  Then use 2 puffs every 4-6 hours as needed for cough    Dispense:  8.5 g    Refill:  0    Time spent:15 Minutes    Johnna Acosta AGNP-C Nurse Practitioner

## 2024-01-26 ENCOUNTER — Other Ambulatory Visit: Payer: Self-pay | Admitting: Adult Health

## 2024-01-26 DIAGNOSIS — R051 Acute cough: Secondary | ICD-10-CM

## 2024-03-09 ENCOUNTER — Encounter: Attending: Physician Assistant | Admitting: Dietician

## 2024-03-09 ENCOUNTER — Encounter: Payer: Self-pay | Admitting: Dietician

## 2024-03-09 DIAGNOSIS — E119 Type 2 diabetes mellitus without complications: Secondary | ICD-10-CM | POA: Diagnosis present

## 2024-03-09 DIAGNOSIS — E1165 Type 2 diabetes mellitus with hyperglycemia: Secondary | ICD-10-CM

## 2024-03-09 DIAGNOSIS — E669 Obesity, unspecified: Secondary | ICD-10-CM | POA: Diagnosis present

## 2024-03-09 DIAGNOSIS — E785 Hyperlipidemia, unspecified: Secondary | ICD-10-CM | POA: Diagnosis present

## 2024-03-09 DIAGNOSIS — Z713 Dietary counseling and surveillance: Secondary | ICD-10-CM | POA: Insufficient documentation

## 2024-03-09 NOTE — Progress Notes (Signed)
 Diabetes Self-Management Education  Visit Type: First/Initial  Appt. Start Time: 0810 Appt. End Time: 0925  03/09/2024  [name], identified by name and date of birth, is a 63 y.o. female with a diagnosis of Diabetes: Type 2.   ASSESSMENT Pt reports desire for refresher on dietary practices for their diabetes. Pt reports taking Horace Lye @15u  (inconsistently)  Jardiance, and Metformin BID for their diabetes. No hypoglycemia or GI distress reported. Pt states they have a high level of stress due to taking care of their sister (bed ridden) for the last 5 years. Pt reports no longer having time for themselves, would usually browse thrift stores (quiet places) to destress, states their husband is a great support system. Pt reports working second shift (3:00 pm - 12:00 pm), will usually have a snack and go to bed after work, wakes up around 7:00 am and has breakfast by 8:00 am, lunch around 1:30 pm, dinner break around 7:30 pm at work (usually works through meal break). Pt reports work is very active, works Public affairs consultant at [employer]. Pt reports following a diabetic friendly diet, keeps added sugars low, avoids SSBs. Pt states they have difficulty being consistent with eating their dinner meal at work, tends to get busy and works through their mealtime.    Diabetes Self-Management Education - 03/09/24 0823       Visit Information   Visit Type First/Initial      Initial Visit   Diabetes Type Type 2    Date Diagnosed 15 years    Are you currently following a meal plan? No    Are you taking your medications as prescribed? Yes      Health Coping   How would you rate your overall health? Good      Psychosocial Assessment   Patient Belief/Attitude about Diabetes Defeat/Burnout   Difficulty obtaining insulin d/t insurance coverage   What is the hardest part about your diabetes right now, causing you the most concern, or is the most worrisome to you about your diabetes?    Taking/obtaining medications;Making healty food and beverage choices    Self-care barriers None    Self-management support Doctor's office;Family    Other persons present Patient    Patient Concerns Glycemic Control;Medication;Nutrition/Meal planning    Special Needs None    Preferred Learning Style Visual    Learning Readiness Not Ready    How often do you need to have someone help you when you read instructions, pamphlets, or other written materials from your doctor or pharmacy? 1 - Never    What is the last grade level you completed in school? 12th grade      Pre-Education Assessment   Patient understands the diabetes disease and treatment process. Needs Review    Patient understands incorporating nutritional management into lifestyle. Needs Review    Patient undertands incorporating physical activity into lifestyle. Needs Review    Patient understands using medications safely. Needs Review    Patient understands monitoring blood glucose, interpreting and using results Needs Review    Patient understands prevention, detection, and treatment of acute complications. Needs Review    Patient understands prevention, detection, and treatment of chronic complications. Needs Review    Patient understands how to develop strategies to address psychosocial issues. Needs Review    Patient understands how to develop strategies to promote health/change behavior. Needs Review      Complications   Last HgB A1C per patient/outside source 11.8 %   12/06/2023   How often do  you check your blood sugar? 1-2 times/day    Fasting Blood glucose range (mg/dL) 147-829;562-130;>865   Varies depending on what they eat after work the night before   Have you had a dilated eye exam in the past 12 months? Yes    Have you had a dental exam in the past 12 months? Yes    Are you checking your feet? Yes    How many days per week are you checking your feet? 1      Dietary Intake   Breakfast Sprouted grain bread, eggs  and spinach, [place] bacon, water    Snack (afternoon) Apple, nuts    Dinner Rotisserie chicken, turnip greens, small piece of cornbread (after work)    Psychologist, sport and exercise) water, diet soda      Activity / Exercise   Activity / Exercise Type ADL's    How many days per week do you exercise? 0    How many minutes per day do you exercise? 0    Total minutes per week of exercise 0      Patient Education   Previous Diabetes Education Yes (please comment)    Disease Pathophysiology Explored patient's options for treatment of their diabetes    Healthy Eating Meal options for control of blood glucose level and chronic complications.;Role of diet in the treatment of diabetes and the relationship between the three main macronutrients and blood glucose level    Medications Taught/reviewed insulin/injectables, injection, site rotation, insulin/injectables storage and needle disposal.;Reviewed patients medication for diabetes, action, purpose, timing of dose and side effects.    Monitoring Identified appropriate SMBG and/or A1C goals.    Chronic complications Relationship between chronic complications and blood glucose control;Identified and discussed with patient  current chronic complications    Diabetes Stress and Support Identified and addressed patients feelings and concerns about diabetes;Worked with patient to identify barriers to care and solutions;Role of stress on diabetes;Brainstormed with patient on coping mechanisms for social situations, getting support from significant others, dealing with feelings about diabetes    Lifestyle and Health Coping Lifestyle issues that need to be addressed for better diabetes care;Helped patient develop diabetes management plan for (enter comment)   Stress reduction and mitigation     Individualized Goals (developed by patient)   Nutrition Follow meal plan discussed    Physical Activity Not Applicable    Medications take my medication as prescribed    Monitoring  Test  my blood glucose as discussed    Problem Solving Medication consistency   Tresiba each night before bed   Reducing Risk examine blood glucose patterns;Other (comment)   De-stress     Post-Education Assessment   Patient understands the diabetes disease and treatment process. Comprehends key points    Patient understands incorporating nutritional management into lifestyle. Comprehends key points    Patient undertands incorporating physical activity into lifestyle. Comprehends key points    Patient understands using medications safely. Comphrehends key points    Patient understands monitoring blood glucose, interpreting and using results Comprehends key points    Patient understands prevention, detection, and treatment of acute complications. Comprehends key points    Patient understands prevention, detection, and treatment of chronic complications. Comprehends key points    Patient understands how to develop strategies to address psychosocial issues. Comprehends key points    Patient understands how to develop strategies to promote health/change behavior. Comprehends key points      Outcomes   Expected Outcomes Demonstrated interest in learning but significant barriers to change  Caretaking for sister, high stress   Future DMSE 2 months    Program Status Not Completed             Individualized Plan for Diabetes Self-Management Training:   Learning Objective:  Patient will have a greater understanding of diabetes self-management. Patient education plan is to attend individual and/or group sessions per assessed needs and concerns.   Plan:   Patient Instructions  Take your Horace Lye at the same time each day. Start taking it in the evening after you have a snack and before bed.  Make it a point to stop whatever you are doing at work AND EAT!! during your dinner break at 7:30 pm!  When snacking on fruits, stick to a serving size of 1 cup (a fist-sized) and only have 2 to 3 servings,  pair it with a source of protein (nuts, low fat cheese/yogurt, peanut butter)  Begin to think about ways to lower your stress moving forward. We will continue to revisit this!  Check your blood sugar each morning before eating or drinking (fasting). Look for numbers under 130 mg/dL  Your goal Z6X is below 7.0%       Expected Outcomes:  Demonstrated interest in learning but significant barriers to change (Caretaking for sister, high stress)  Education material provided: My Plate and Carbohydrate counting sheet  If problems or questions, patient to contact team via:  Phone and Email  Future DSME appointment: 2 months

## 2024-03-09 NOTE — Patient Instructions (Addendum)
 Take your Horace Lye at the same time each day. Start taking it in the evening after you have a snack and before bed.  Make it a point to stop whatever you are doing at work AND EAT!! during your dinner break at 7:30 pm!  When snacking on fruits, stick to a serving size of 1 cup (a fist-sized) and only have 2 to 3 servings, pair it with a source of protein (nuts, low fat cheese/yogurt, peanut butter)  Begin to think about ways to lower your stress moving forward. We will continue to revisit this!  Check your blood sugar each morning before eating or drinking (fasting). Look for numbers under 130 mg/dL  Your goal Z6X is below 7.0%

## 2024-06-08 ENCOUNTER — Other Ambulatory Visit: Payer: Self-pay

## 2024-06-08 DIAGNOSIS — E1165 Type 2 diabetes mellitus with hyperglycemia: Secondary | ICD-10-CM

## 2024-07-22 ENCOUNTER — Other Ambulatory Visit: Payer: Self-pay

## 2024-07-22 DIAGNOSIS — E1165 Type 2 diabetes mellitus with hyperglycemia: Secondary | ICD-10-CM

## 2024-07-23 LAB — HEMOGLOBIN A1C
Est. average glucose Bld gHb Est-mCnc: 260 mg/dL
Hgb A1c MFr Bld: 10.7 % — ABNORMAL HIGH (ref 4.8–5.6)

## 2024-11-24 ENCOUNTER — Other Ambulatory Visit: Payer: Self-pay

## 2024-11-24 DIAGNOSIS — R809 Proteinuria, unspecified: Secondary | ICD-10-CM

## 2024-11-24 DIAGNOSIS — Z794 Long term (current) use of insulin: Secondary | ICD-10-CM

## 2024-11-24 DIAGNOSIS — E1129 Type 2 diabetes mellitus with other diabetic kidney complication: Secondary | ICD-10-CM

## 2024-11-25 ENCOUNTER — Other Ambulatory Visit: Payer: Self-pay | Admitting: Physician Assistant

## 2024-11-25 DIAGNOSIS — Z1231 Encounter for screening mammogram for malignant neoplasm of breast: Secondary | ICD-10-CM

## 2024-11-26 ENCOUNTER — Other Ambulatory Visit: Payer: Self-pay

## 2024-11-26 DIAGNOSIS — Z794 Long term (current) use of insulin: Secondary | ICD-10-CM

## 2024-11-26 DIAGNOSIS — I10 Essential (primary) hypertension: Secondary | ICD-10-CM

## 2024-11-26 DIAGNOSIS — R809 Proteinuria, unspecified: Secondary | ICD-10-CM

## 2024-11-26 DIAGNOSIS — E1129 Type 2 diabetes mellitus with other diabetic kidney complication: Secondary | ICD-10-CM

## 2024-11-27 LAB — CBC WITH DIFFERENTIAL/PLATELET
Basophils Absolute: 0.1 x10E3/uL (ref 0.0–0.2)
Basos: 1 %
EOS (ABSOLUTE): 0.1 x10E3/uL (ref 0.0–0.4)
Eos: 1 %
Hematocrit: 41.2 % (ref 34.0–46.6)
Hemoglobin: 13.6 g/dL (ref 11.1–15.9)
Immature Grans (Abs): 0 x10E3/uL (ref 0.0–0.1)
Immature Granulocytes: 0 %
Lymphocytes Absolute: 1.2 x10E3/uL (ref 0.7–3.1)
Lymphs: 15 %
MCH: 25 pg — ABNORMAL LOW (ref 26.6–33.0)
MCHC: 33 g/dL (ref 31.5–35.7)
MCV: 76 fL — ABNORMAL LOW (ref 79–97)
Monocytes Absolute: 0.5 x10E3/uL (ref 0.1–0.9)
Monocytes: 6 %
Neutrophils Absolute: 6.3 x10E3/uL (ref 1.4–7.0)
Neutrophils: 77 %
Platelets: 323 x10E3/uL (ref 150–450)
RBC: 5.43 x10E6/uL — ABNORMAL HIGH (ref 3.77–5.28)
RDW: 14.4 % (ref 11.7–15.4)
WBC: 8.1 x10E3/uL (ref 3.4–10.8)

## 2024-11-27 LAB — URINALYSIS, ROUTINE W REFLEX MICROSCOPIC
Bilirubin, UA: NEGATIVE
Ketones, UA: NEGATIVE
Leukocytes,UA: NEGATIVE
Nitrite, UA: NEGATIVE
RBC, UA: NEGATIVE
Specific Gravity, UA: 1.025 (ref 1.005–1.030)
Urobilinogen, Ur: 0.2 mg/dL (ref 0.2–1.0)
pH, UA: 5 (ref 5.0–7.5)

## 2024-11-27 LAB — MICROALBUMIN / CREATININE URINE RATIO
Creatinine, Urine: 69.8 mg/dL
Microalb/Creat Ratio: 260 mg/g{creat} — ABNORMAL HIGH (ref 0–29)
Microalbumin, Urine: 181.4 ug/mL

## 2024-11-27 LAB — COMPREHENSIVE METABOLIC PANEL WITH GFR
ALT: 21 IU/L (ref 0–32)
AST: 23 IU/L (ref 0–40)
Albumin: 4.5 g/dL (ref 3.9–4.9)
Alkaline Phosphatase: 238 IU/L — ABNORMAL HIGH (ref 49–135)
BUN/Creatinine Ratio: 35 — ABNORMAL HIGH (ref 12–28)
BUN: 29 mg/dL — ABNORMAL HIGH (ref 8–27)
Bilirubin Total: 0.3 mg/dL (ref 0.0–1.2)
CO2: 23 mmol/L (ref 20–29)
Calcium: 9.5 mg/dL (ref 8.7–10.3)
Chloride: 104 mmol/L (ref 96–106)
Creatinine, Ser: 0.84 mg/dL (ref 0.57–1.00)
Globulin, Total: 2.9 g/dL (ref 1.5–4.5)
Glucose: 112 mg/dL — ABNORMAL HIGH (ref 70–99)
Potassium: 4.3 mmol/L (ref 3.5–5.2)
Sodium: 143 mmol/L (ref 134–144)
Total Protein: 7.4 g/dL (ref 6.0–8.5)
eGFR: 79 mL/min/1.73

## 2024-11-27 LAB — MICROSCOPIC EXAMINATION
Bacteria, UA: NONE SEEN
Casts: NONE SEEN /LPF
RBC, Urine: NONE SEEN /HPF (ref 0–2)

## 2024-11-27 LAB — HEMOGLOBIN A1C
Est. average glucose Bld gHb Est-mCnc: 223 mg/dL
Hgb A1c MFr Bld: 9.4 % — ABNORMAL HIGH (ref 4.8–5.6)

## 2024-11-27 LAB — LIPID PANEL
Chol/HDL Ratio: 3.5 ratio (ref 0.0–4.4)
Cholesterol, Total: 190 mg/dL (ref 100–199)
HDL: 55 mg/dL
LDL Chol Calc (NIH): 112 mg/dL — ABNORMAL HIGH (ref 0–99)
Triglycerides: 127 mg/dL (ref 0–149)
VLDL Cholesterol Cal: 23 mg/dL (ref 5–40)

## 2025-01-07 ENCOUNTER — Encounter
